# Patient Record
Sex: Male | Born: 1955
Health system: Southern US, Community
[De-identification: ages and names within clinical notes are randomized; demographics above are authoritative.]

## PROBLEM LIST (undated history)

## (undated) DIAGNOSIS — I2510 Atherosclerotic heart disease of native coronary artery without angina pectoris: Secondary | ICD-10-CM

## (undated) DIAGNOSIS — M1990 Unspecified osteoarthritis, unspecified site: Secondary | ICD-10-CM

## (undated) DIAGNOSIS — M792 Neuralgia and neuritis, unspecified: Secondary | ICD-10-CM

## (undated) DIAGNOSIS — G4730 Sleep apnea, unspecified: Secondary | ICD-10-CM

## (undated) DIAGNOSIS — Z955 Presence of coronary angioplasty implant and graft: Secondary | ICD-10-CM

## (undated) DIAGNOSIS — M109 Gout, unspecified: Secondary | ICD-10-CM

## (undated) DIAGNOSIS — I10 Essential (primary) hypertension: Secondary | ICD-10-CM

## (undated) DIAGNOSIS — D649 Anemia, unspecified: Secondary | ICD-10-CM

## (undated) DIAGNOSIS — G609 Hereditary and idiopathic neuropathy, unspecified: Secondary | ICD-10-CM

## (undated) DIAGNOSIS — Z8719 Personal history of other diseases of the digestive system: Secondary | ICD-10-CM

## (undated) DIAGNOSIS — T884XXA Failed or difficult intubation, initial encounter: Secondary | ICD-10-CM

## (undated) DIAGNOSIS — G8929 Other chronic pain: Secondary | ICD-10-CM

## (undated) DIAGNOSIS — N183 Chronic kidney disease, stage 3 unspecified: Secondary | ICD-10-CM

## (undated) DIAGNOSIS — M542 Cervicalgia: Secondary | ICD-10-CM

## (undated) DIAGNOSIS — M4804 Spinal stenosis, thoracic region: Secondary | ICD-10-CM

## (undated) DIAGNOSIS — M419 Scoliosis, unspecified: Secondary | ICD-10-CM

## (undated) DIAGNOSIS — E785 Hyperlipidemia, unspecified: Secondary | ICD-10-CM

## (undated) DIAGNOSIS — M5030 Other cervical disc degeneration, unspecified cervical region: Secondary | ICD-10-CM

## (undated) DIAGNOSIS — N189 Chronic kidney disease, unspecified: Secondary | ICD-10-CM

## (undated) DIAGNOSIS — I5189 Other ill-defined heart diseases: Secondary | ICD-10-CM

## (undated) DIAGNOSIS — F40240 Claustrophobia: Secondary | ICD-10-CM

## (undated) DIAGNOSIS — Z7982 Long term (current) use of aspirin: Secondary | ICD-10-CM

## (undated) HISTORY — DX: Sleep apnea, unspecified: G47.30

## (undated) HISTORY — PX: VASECTOMY: SHX75

## (undated) HISTORY — PX: HYDROCELE EXCISION: SHX482

---

## 2010-03-11 ENCOUNTER — Encounter: Payer: Self-pay | Admitting: Orthopaedic Surgery

## 2010-04-04 ENCOUNTER — Encounter: Payer: Self-pay | Admitting: Orthopaedic Surgery

## 2010-05-04 ENCOUNTER — Encounter: Payer: Self-pay | Admitting: Orthopaedic Surgery

## 2010-05-27 ENCOUNTER — Ambulatory Visit: Payer: Self-pay | Admitting: Internal Medicine

## 2010-06-04 ENCOUNTER — Ambulatory Visit: Payer: Self-pay | Admitting: Internal Medicine

## 2010-08-14 HISTORY — PX: COLONOSCOPY: SHX174

## 2010-08-14 LAB — HM COLONOSCOPY

## 2010-09-04 ENCOUNTER — Ambulatory Visit: Payer: Self-pay | Admitting: Family Medicine

## 2010-09-04 HISTORY — PX: CORONARY ANGIOPLASTY WITH STENT PLACEMENT: SHX49

## 2010-11-05 ENCOUNTER — Encounter: Payer: Self-pay | Admitting: Cardiology

## 2010-12-03 ENCOUNTER — Encounter: Payer: Self-pay | Admitting: Cardiology

## 2011-01-03 ENCOUNTER — Encounter: Payer: Self-pay | Admitting: Cardiology

## 2011-01-31 ENCOUNTER — Ambulatory Visit: Payer: Self-pay | Admitting: Otolaryngology

## 2011-02-04 ENCOUNTER — Ambulatory Visit: Payer: Self-pay | Admitting: Otolaryngology

## 2011-04-01 ENCOUNTER — Ambulatory Visit: Payer: Self-pay

## 2011-08-31 IMAGING — CR DG AC JOINTS BILAT
1 series · 2 of 2 positions shown · non-contrast
Comparison: none

REASON FOR EXAM: L shoulder pain, evaluate for AC separation
COMMENTS:

[Series 1: view not recorded · 0.17mm/px · 2 of 2 slices shown]
[im 1/2]
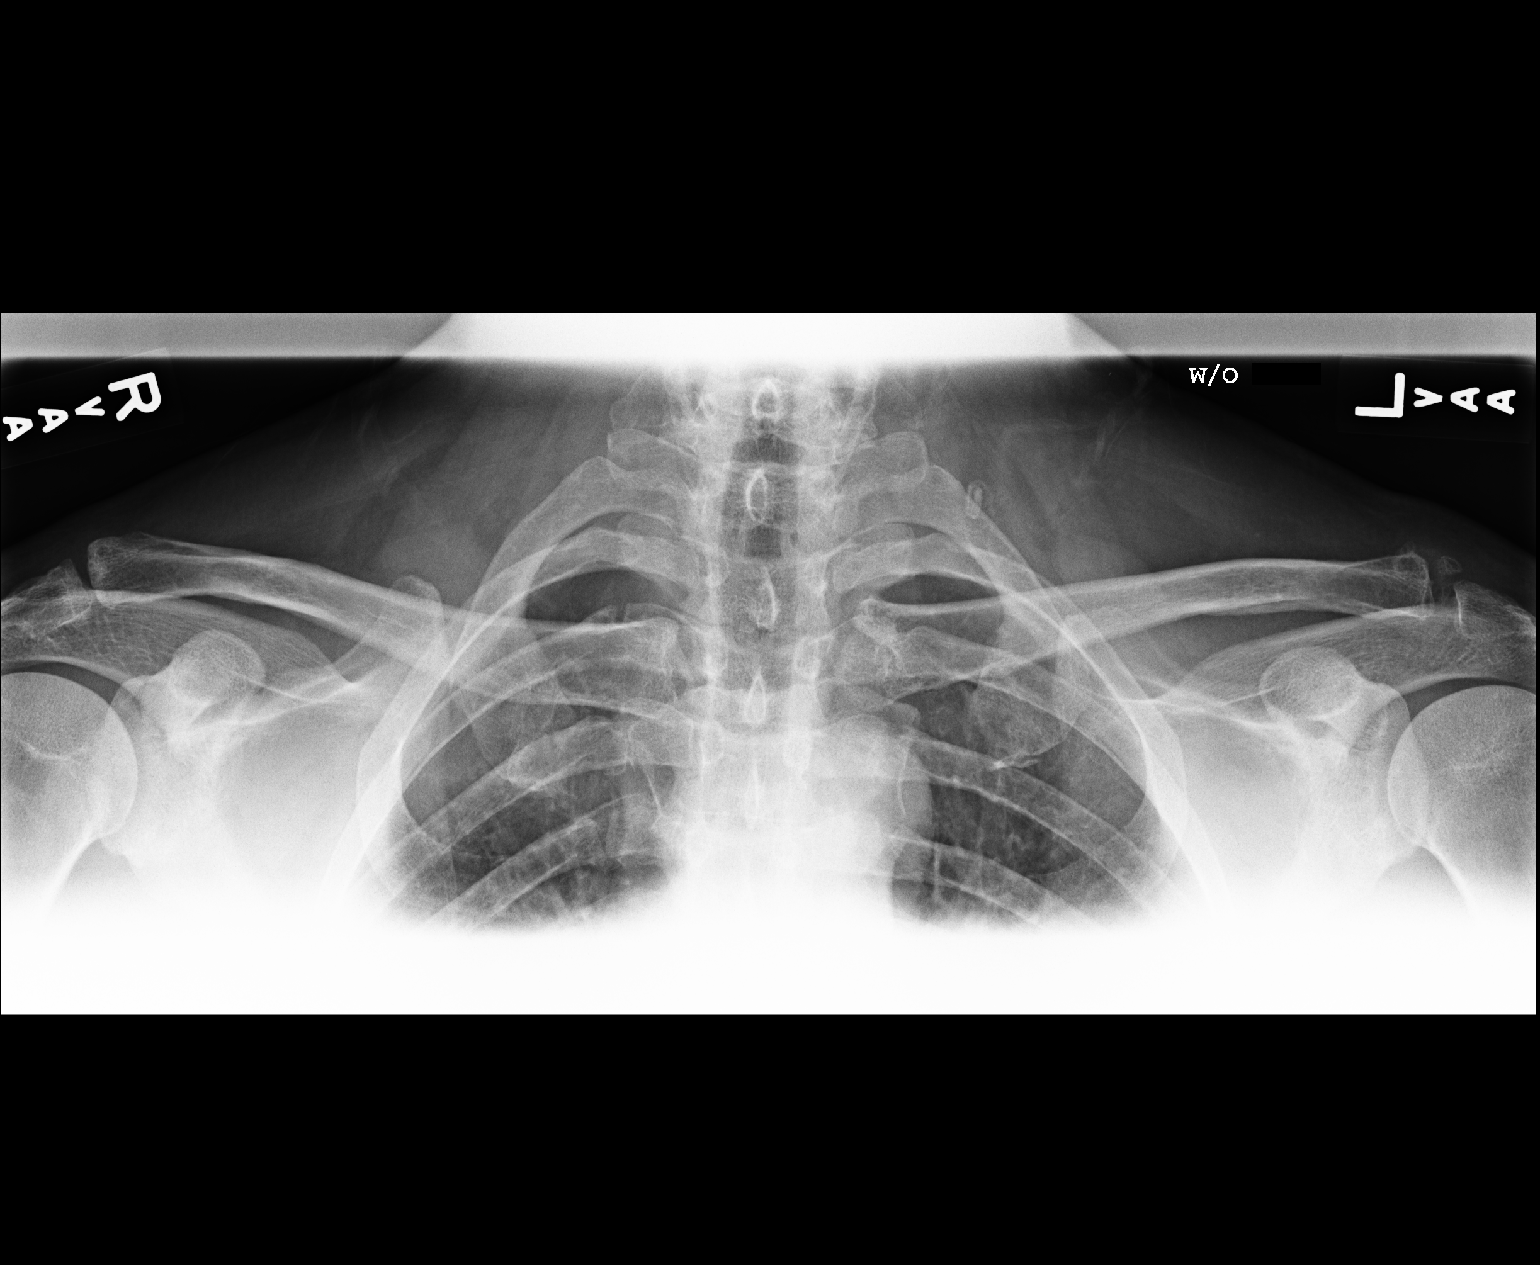
[im 2/2]
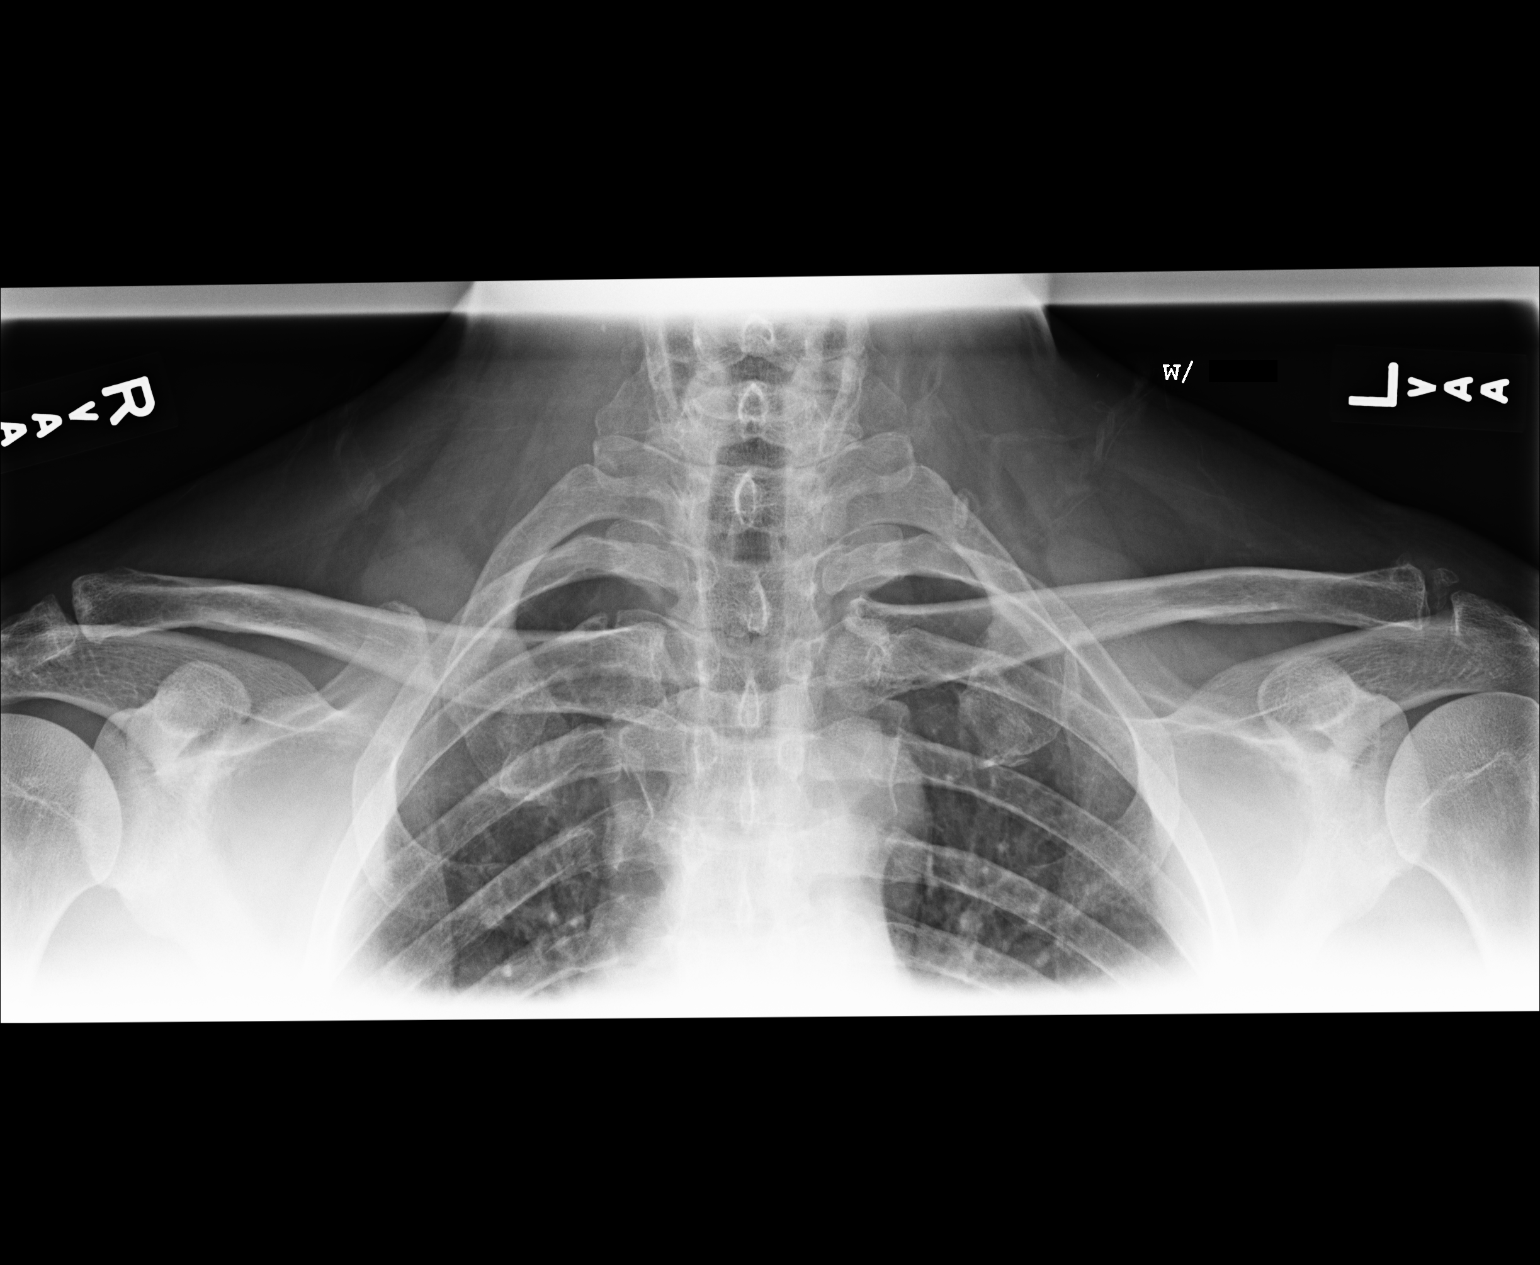

[2 of 2 positions shown; findings below may reference images not displayed]

PROCEDURE:     MDR - MDR ACROMO-CLAVICULAR JOINTS  - September 04, 2010 [DATE]

RESULT:     Bilateral views of the acromioclavicular joints were obtained
with and without weight-bearing. No subluxation at the left
acromioclavicular joint is seen. The joint space is slightly widened and
calcification is observed within the joint space. The findings suggest
residual change from prior grade 1 acromioclavicular separation. A grade 1
reinjury cannot be excluded but as noted there is no subluxation of the
lateral aspect of the left clavicle with respect to the acromion and no
widening of the acromiocoracoid space is seen to suggest coracoclavicular
ligament injury. The right clavicle is normal in appearance.
IMPRESSION: 1.     Chronic or subacute changes are noted at the left acromioclavicular
joint as described above but with no acromioclavicular subluxation
identified.

## 2011-10-08 DIAGNOSIS — G4733 Obstructive sleep apnea (adult) (pediatric): Secondary | ICD-10-CM | POA: Insufficient documentation

## 2011-10-08 DIAGNOSIS — I25119 Atherosclerotic heart disease of native coronary artery with unspecified angina pectoris: Secondary | ICD-10-CM | POA: Insufficient documentation

## 2011-10-08 DIAGNOSIS — I2510 Atherosclerotic heart disease of native coronary artery without angina pectoris: Secondary | ICD-10-CM | POA: Insufficient documentation

## 2013-09-04 HISTORY — PX: BILATERAL CARPAL TUNNEL RELEASE: SHX6508

## 2013-09-13 LAB — LIPID PANEL
Cholesterol: 144 mg/dL (ref 0–200)
HDL: 25 mg/dL — AB (ref 35–70)
LDL Cholesterol: 63 mg/dL
Triglycerides: 278 mg/dL — AB (ref 40–160)

## 2013-09-13 LAB — PSA: PSA: 1.2

## 2013-09-26 ENCOUNTER — Ambulatory Visit: Payer: Self-pay | Admitting: Orthopedic Surgery

## 2013-09-29 ENCOUNTER — Ambulatory Visit: Payer: Self-pay | Admitting: Orthopedic Surgery

## 2014-11-20 ENCOUNTER — Encounter: Payer: Self-pay | Admitting: Internal Medicine

## 2014-11-20 DIAGNOSIS — M1A0790 Idiopathic chronic gout, unspecified ankle and foot, without tophus (tophi): Secondary | ICD-10-CM | POA: Insufficient documentation

## 2014-11-20 DIAGNOSIS — D126 Benign neoplasm of colon, unspecified: Secondary | ICD-10-CM | POA: Insufficient documentation

## 2014-11-20 DIAGNOSIS — I10 Essential (primary) hypertension: Secondary | ICD-10-CM | POA: Insufficient documentation

## 2014-11-20 DIAGNOSIS — N529 Male erectile dysfunction, unspecified: Secondary | ICD-10-CM | POA: Insufficient documentation

## 2014-11-20 DIAGNOSIS — M5090 Cervical disc disorder, unspecified, unspecified cervical region: Secondary | ICD-10-CM | POA: Insufficient documentation

## 2014-11-20 DIAGNOSIS — G609 Hereditary and idiopathic neuropathy, unspecified: Secondary | ICD-10-CM | POA: Insufficient documentation

## 2014-11-20 DIAGNOSIS — E785 Hyperlipidemia, unspecified: Secondary | ICD-10-CM | POA: Insufficient documentation

## 2014-11-20 DIAGNOSIS — I2510 Atherosclerotic heart disease of native coronary artery without angina pectoris: Secondary | ICD-10-CM | POA: Insufficient documentation

## 2014-11-25 NOTE — Op Note (Signed)
PATIENT NAME:  Brian Payne, Brian Payne MR#:  [id number] DATE OF BIRTH:  August 23, 1955  DATE OF PROCEDURE:  09/29/2013  PREOPERATIVE DIAGNOSIS: Bilateral carpal tunnel syndrome.   POSTOPERATIVE DIAGNOSIS: Bilateral carpal tunnel syndrome.   PROCEDURE: Bilateral carpal tunnel release.   ANESTHESIA: General.   SURGEON: Laurene Footman, MD  DESCRIPTION OF PROCEDURE: The patient was brought to the operating room, and after adequate anesthesia was obtained, both arms were prepped and draped in the usual sterile fashion. The left arm was approached first. After patient identification and timeout procedures were completed, the tourniquet was raised on the left arm. A volar incision, approximately 2.5 cm incision in line with the ring metacarpal was carried out down through the skin and subcutaneous tissue. The transverse carpal ligament was identified and opened. A vascular hemostat was placed underneath this to protect the underlying structure. Release was carried out first distally and then proximally. In the proximal third of the carpal tunnel, there appeared to be significant compression of the nerve, and after release of this, there was good vascular blush. Inspection of the carpal tunnel revealed moderate flexor tenosynovitis. No crystalline disease was apparent, as the patient does have a gout history. After release and having appeared to have adequate release, the subcutaneous tissue was infiltrated with 10 mL of 0.5% Sensorcaine for postoperative analgesia. The wound was closed with simple interrupted 4-0 nylon skin suture. Xeroform, 4 x 4's, Webril and Ace wrap were applied. Tourniquet was let down. The right side was then approached in a similar fashion with identical findings.   TOURNIQUET TIME: Both sides had 13 minute tourniquet time with 250 mmHg.   COMPLICATIONS: There were no complications on either hand.  ESTIMATED BLOOD LOSS: Minimal.   CONDITION: To recovery room,  stable.  ____________________________ Laurene Footman, MD mjm:lb D: 09/29/2013 19:07:13 ET T: 09/30/2013 07:03:46 ET JOB#: [id number]  cc: Laurene Footman, MD, <Dictator> Laurene Footman MD ELECTRONICALLY SIGNED 09/30/2013 7:39

## 2015-01-17 ENCOUNTER — Encounter: Payer: Self-pay | Admitting: Internal Medicine

## 2015-01-17 ENCOUNTER — Ambulatory Visit (INDEPENDENT_AMBULATORY_CARE_PROVIDER_SITE_OTHER): Payer: BLUE CROSS/BLUE SHIELD | Admitting: Internal Medicine

## 2015-01-17 ENCOUNTER — Other Ambulatory Visit: Payer: Self-pay | Admitting: Internal Medicine

## 2015-01-17 VITALS — BP 144/98 | HR 72 | Ht 70.0 in | Wt 256.2 lb

## 2015-01-17 DIAGNOSIS — I10 Essential (primary) hypertension: Secondary | ICD-10-CM

## 2015-01-17 DIAGNOSIS — I2510 Atherosclerotic heart disease of native coronary artery without angina pectoris: Secondary | ICD-10-CM | POA: Diagnosis not present

## 2015-01-17 DIAGNOSIS — Z0000 Encounter for general adult medical examination without abnormal findings: Secondary | ICD-10-CM

## 2015-01-17 DIAGNOSIS — Z125 Encounter for screening for malignant neoplasm of prostate: Secondary | ICD-10-CM

## 2015-01-17 DIAGNOSIS — E785 Hyperlipidemia, unspecified: Secondary | ICD-10-CM | POA: Diagnosis not present

## 2015-01-17 LAB — POCT URINALYSIS DIPSTICK
BILIRUBIN UA: NEGATIVE
Glucose, UA: NEGATIVE
Ketones, UA: NEGATIVE
Leukocytes, UA: NEGATIVE
Nitrite, UA: NEGATIVE
PH UA: 5
Protein, UA: NEGATIVE
RBC UA: NEGATIVE
Spec Grav, UA: 1.015
Urobilinogen, UA: 0.2

## 2015-01-17 MED ORDER — LOSARTAN POTASSIUM 100 MG PO TABS: 100.0000 mg | ORAL_TABLET | Freq: Every day | ORAL | Status: DC

## 2015-01-17 NOTE — Patient Instructions (Signed)
Increase Losartan to 100 mg per day (one whole pill)

## 2015-01-17 NOTE — Progress Notes (Signed)
Date:  01/17/2015   Name:  Brian Payne   DOB:  11/05/1955   MRN:  [id number]   Chief Complaint: Annual Exam and Hypertension Brian Payne is a 59 y.o. male who presents today for his Complete Annual Exam. He feels fairly well. He reports exercising - elliptical, walking etc 30 minutes daily, plus yard work. He reports he is sleeping well. He is compliant with his CPap use. Hypertension This is a chronic problem. The current episode started more than 1 year ago. The problem is resistant (tends to go up with stress or exertion). Associated symptoms include neck pain (followed by Dr. Sharlet Payne). Pertinent negatives include no chest pain, headaches, palpitations or shortness of breath. There are no associated agents to hypertension. Hypertensive end-organ damage includes CAD/MI. There is no history of PVD or retinopathy. There is no history of chronic renal disease.  Hyperlipidemia This is a chronic problem. The current episode started more than 1 year ago. The problem is controlled. Recent lipid tests were reviewed and are normal. He has no history of chronic renal disease, diabetes or nephrotic syndrome. Pertinent negatives include no chest pain, myalgias or shortness of breath. Current antihyperlipidemic treatment includes statins. The current treatment provides significant improvement of lipids. There are no compliance problems.    Chronic neck pain -He is followed by pain management.  Current therapy is hydrocodone and gabapentin.  He does fairly well on this regimen.  He has tried ESI without improvement.  He is not a surgical candidate.  Neuropathy - stable numbness in both feet - diagnosed as idiopathic.  He also has numbness in his fingers that did not improve after CTR.  It is not worsening and does not limit his activities except for fine motor movements with small objects.  Review of Systems  Constitutional: Negative for fever, activity change, appetite change and unexpected weight change.   HENT: Positive for congestion, sinus pressure (worse with CPAP use) and tinnitus. Negative for hearing loss, nosebleeds and trouble swallowing.   Eyes: Negative for photophobia and visual disturbance.  Respiratory: Positive for apnea. Negative for choking, chest tightness, shortness of breath and wheezing.   Cardiovascular: Negative for chest pain, palpitations and leg swelling.  Gastrointestinal: Negative for nausea, abdominal pain, diarrhea, constipation and blood in stool.  Endocrine: Negative for polyuria.  Genitourinary: Negative for urgency, frequency, hematuria and difficulty urinating.  Musculoskeletal: Positive for joint swelling (with gout flares in feet) and neck pain (followed by Dr. Sharlet Payne). Negative for myalgias.  Skin: Negative for color change and rash.       He noticed a soft tissue fullness over right lateral/posterior lower ribs   Neurological: Positive for numbness (both feet). Negative for dizziness, tremors, weakness and headaches.  Hematological: Negative for adenopathy.  Psychiatric/Behavioral: Negative for suicidal ideas, sleep disturbance and dysphoric mood.    Patient Active Problem List   Diagnosis Date Noted  . Cervical disc disease 11/20/2014  . Tubular adenoma of colon 11/20/2014  . Gout 11/20/2014  . Dyslipidemia 11/20/2014  . ED (erectile dysfunction) of organic origin 11/20/2014  . Essential (primary) hypertension 11/20/2014  . Idiopathic peripheral neuropathy 11/20/2014  . Obstructive apnea 10/08/2011  . Arteriosclerosis of coronary artery 10/08/2011    Prior to Admission medications   Medication Sig Start Date End Date Taking? Authorizing Provider  allopurinol (ZYLOPRIM) 300 MG tablet Take 2 tablets by mouth daily.   Yes Historical Provider, MD  aspirin 81 MG chewable tablet Chew 1 tablet by mouth daily.  Yes Historical Provider, MD  gabapentin (NEURONTIN) 100 MG capsule Take 5 capsules by mouth daily. One in AM and 4 in PM 12/21/14  Yes  Historical Provider, MD  HYDROcodone-acetaminophen (NORCO) 7.5-325 MG per tablet Take 0.5-1 tablets by mouth 2 (two) times daily as needed. 01/08/15  Yes Historical Provider, MD  losartan (COZAAR) 100 MG tablet Take 0.5 tablets by mouth daily. 07/24/14  Yes Historical Provider, MD  meloxicam (MOBIC) 7.5 MG tablet Take 1 tablet by mouth daily. 01/07/15  Yes Historical Provider, MD  metoprolol tartrate (LOPRESSOR) 25 MG tablet Take 1 tablet by mouth daily. 10/09/14  Yes Historical Provider, MD  nitroGLYCERIN (NITROSTAT) 0.4 MG SL tablet Place 1 tablet under the tongue as needed.   Yes Historical Provider, MD  Omeprazole 20 MG TBEC Take 1 tablet by mouth daily.    Historical Provider, MD  rosuvastatin (CRESTOR) 5 MG tablet Take 0.5 tablets by mouth every other day.    Historical Provider, MD    Allergies  Allergen Reactions  . Statins Other (See Comments)    Muscle aches  . Codeine Itching and Rash    Past Surgical History  Procedure Laterality Date  . Vasectomy    . Hydrocele excision    . Coronary angioplasty with stent placement  020112    [facility]  . Bilateral carpal tunnel release  09/2013    History  Substance Use Topics  . Smoking status: Never Smoker   . Smokeless tobacco: Not on file  . Alcohol Use: No     Medication list has been reviewed and updated.  Physical Examination:  Physical Exam  Constitutional: He is oriented to person, place, and time. He appears well-developed and well-nourished. No distress.  HENT:  Head: Normocephalic and atraumatic.  Right Ear: External ear normal.  Left Ear: External ear normal.  Mouth/Throat: Oropharynx is clear and moist.  Eyes: Conjunctivae are normal. Pupils are equal, round, and reactive to light.  Neck: Normal range of motion. Neck supple. No thyromegaly present.  Cardiovascular: Normal rate, regular rhythm and normal heart sounds.   Pulmonary/Chest: Effort normal and breath sounds normal. No respiratory distress. He has no wheezes.  He has no rales.  Abdominal: Soft. Bowel sounds are normal. He exhibits no distension and no mass. There is no tenderness.  Musculoskeletal: Normal range of motion. He exhibits no edema.       Right knee: He exhibits no swelling and no effusion.       Left knee: He exhibits no swelling and no effusion. Tenderness found. Medial joint line tenderness noted.  Lymphadenopathy:    He has no cervical adenopathy.  Neurological: He is alert and oriented to person, place, and time. He has normal reflexes. A sensory deficit (decreased sensation to light touch fingertips and feet) is present.  Skin: Skin is warm, dry and intact.     Psychiatric: He has a normal mood and affect. His behavior is normal. Thought content normal.  Nursing note and vitals reviewed.  BP 144/98 mmHg  Pulse 72  Ht 5\' 10"  (1.778 m)  Wt 256 lb 3.2 oz (116.212 kg)  BMI 36.76 kg/m2  Assessment and Plan: 1. Annual physical exam Stable exam.  Pt encouraged to continue exercise Reassured regarding probably lipoma - can refer for excision if symptomatic Continue follow up with specialists for neck pain and gout - POCT urinalysis dipstick  2. Essential (primary) hypertension Not well controlled Will increase dose of losartan to 100 mg - CBC with Differential/Platelet - TSH -  losartan (COZAAR) 100 MG tablet; Take 1 tablet (100 mg total) by mouth daily.  Dispense: 30 tablet; Refill: 5  3. Dyslipidemia On crestor - Lipid panel  4. Arteriosclerosis of coronary artery Stable without sympotms Continue aspirin and beta blocker - Comprehensive metabolic panel  5. Prostate cancer screening DRE deferred to lack of symptoms - PSA   Halina Maidens, MD [facility]  01/17/2015

## 2015-01-18 LAB — COMPREHENSIVE METABOLIC PANEL
ALT: 34 IU/L (ref 0–44)
AST: 29 IU/L (ref 0–40)
Albumin/Globulin Ratio: 2.1 (ref 1.1–2.5)
Albumin: 4.8 g/dL (ref 3.5–5.5)
Alkaline Phosphatase: 61 IU/L (ref 39–117)
BUN / CREAT RATIO: 22 — AB (ref 9–20)
BUN: 26 mg/dL — ABNORMAL HIGH (ref 6–24)
Bilirubin Total: 0.7 mg/dL (ref 0.0–1.2)
CALCIUM: 10 mg/dL (ref 8.7–10.2)
CO2: 21 mmol/L (ref 18–29)
CREATININE: 1.16 mg/dL (ref 0.76–1.27)
Chloride: 105 mmol/L (ref 97–108)
GFR calc non Af Amer: 69 mL/min/{1.73_m2} (ref >59)
GFR, EST AFRICAN AMERICAN: 79 mL/min/{1.73_m2} (ref >59)
GLOBULIN, TOTAL: 2.3 g/dL (ref 1.5–4.5)
Glucose: 89 mg/dL (ref 65–99)
Potassium: 4.4 mmol/L (ref 3.5–5.2)
Sodium: 143 mmol/L (ref 134–144)
Total Protein: 7.1 g/dL (ref 6.0–8.5)

## 2015-01-18 LAB — LIPID PANEL
CHOLESTEROL TOTAL: 172 mg/dL (ref 100–199)
Chol/HDL Ratio: 5.9 ratio units — ABNORMAL HIGH (ref 0.0–5.0)
HDL: 29 mg/dL — AB (ref >39)
LDL Calculated: 89 mg/dL (ref 0–99)
TRIGLYCERIDES: 268 mg/dL — AB (ref 0–149)
VLDL Cholesterol Cal: 54 mg/dL — ABNORMAL HIGH (ref 5–40)

## 2015-01-18 LAB — CBC WITH DIFFERENTIAL/PLATELET
Basophils Absolute: 0 10*3/uL (ref 0.0–0.2)
Basos: 1 %
EOS (ABSOLUTE): 0.2 10*3/uL (ref 0.0–0.4)
EOS: 4 %
Hematocrit: 43.3 % (ref 37.5–51.0)
Hemoglobin: 14.4 g/dL (ref 12.6–17.7)
IMMATURE GRANS (ABS): 0 10*3/uL (ref 0.0–0.1)
IMMATURE GRANULOCYTES: 0 %
Lymphocytes Absolute: 1.4 10*3/uL (ref 0.7–3.1)
Lymphs: 26 %
MCH: 31.3 pg (ref 26.6–33.0)
MCHC: 33.3 g/dL (ref 31.5–35.7)
MCV: 94 fL (ref 79–97)
Monocytes Absolute: 0.6 10*3/uL (ref 0.1–0.9)
Monocytes: 10 %
NEUTROS PCT: 59 %
Neutrophils Absolute: 3.3 10*3/uL (ref 1.4–7.0)
PLATELETS: 153 10*3/uL (ref 150–379)
RBC: 4.6 x10E6/uL (ref 4.14–5.80)
RDW: 14.7 % (ref 12.3–15.4)
WBC: 5.5 10*3/uL (ref 3.4–10.8)

## 2015-01-18 LAB — TSH: TSH: 1.11 u[IU]/mL (ref 0.450–4.500)

## 2015-01-18 LAB — PSA: Prostate Specific Ag, Serum: 1.6 ng/mL (ref 0.0–4.0)

## 2015-01-29 ENCOUNTER — Other Ambulatory Visit: Payer: Self-pay

## 2015-01-29 DIAGNOSIS — I10 Essential (primary) hypertension: Secondary | ICD-10-CM

## 2015-01-29 MED ORDER — LOSARTAN POTASSIUM 100 MG PO TABS: 100.0000 mg | ORAL_TABLET | Freq: Every day | ORAL | Status: DC

## 2015-02-12 ENCOUNTER — Encounter: Payer: Self-pay | Admitting: Internal Medicine

## 2015-02-12 ENCOUNTER — Ambulatory Visit (INDEPENDENT_AMBULATORY_CARE_PROVIDER_SITE_OTHER): Payer: BLUE CROSS/BLUE SHIELD | Admitting: Internal Medicine

## 2015-02-12 VITALS — BP 140/82 | HR 72 | Ht 70.0 in | Wt 260.2 lb

## 2015-02-12 DIAGNOSIS — M5090 Cervical disc disorder, unspecified, unspecified cervical region: Secondary | ICD-10-CM | POA: Diagnosis not present

## 2015-02-12 DIAGNOSIS — I10 Essential (primary) hypertension: Secondary | ICD-10-CM

## 2015-02-12 DIAGNOSIS — H9312 Tinnitus, left ear: Secondary | ICD-10-CM | POA: Insufficient documentation

## 2015-02-12 DIAGNOSIS — E785 Hyperlipidemia, unspecified: Secondary | ICD-10-CM

## 2015-02-12 DIAGNOSIS — I2510 Atherosclerotic heart disease of native coronary artery without angina pectoris: Secondary | ICD-10-CM

## 2015-02-12 NOTE — Progress Notes (Signed)
Date:  02/12/2015   Name:  Brian Payne   DOB:  [birth year]   MRN:  [id number]   Chief Complaint: No chief complaint on file. Hypertension This is a chronic problem. The current episode started more than 1 year ago. The problem has been waxing and waning since onset. The problem is uncontrolled. Associated symptoms include neck pain. Pertinent negatives include no chest pain, headaches or shortness of breath. Past treatments include angiotensin blockers and beta blockers (unable to tolerate losartan 100 mg due to lightheadness so stopped it completely). The current treatment provides moderate improvement. Compliance problems include medication side effects.  Hypertensive end-organ damage includes CAD/MI.  Hyperlipidemia This is a chronic problem. The problem is uncontrolled. Recent lipid tests were reviewed and are high. Associated symptoms include leg pain and myalgias. Pertinent negatives include no chest pain or shortness of breath. Treatments tried: stopped crestor 2.5 mg QOD due to myalgia. Compliance problems include medication side effects.  Risk factors for coronary artery disease include dyslipidemia.   He was seen by Dr. Sharlet Salina - mentioned that he gets dizzy when he looks up and has some posterior neck pain.  Dr. Sharlet Salina suggested that he might need a Carotid US.  He also has tinnitus for which he has seen ENT - no obvious cause found.  Review of Systems:  Review of Systems  Constitutional: Negative for fever.  HENT: Positive for tinnitus (on left).   Respiratory: Negative for cough, choking and shortness of breath.   Cardiovascular: Negative for chest pain.  Musculoskeletal: Positive for myalgias, back pain (neck pain), arthralgias and neck pain.  Neurological: Positive for light-headedness (when he looks up). Negative for syncope and headaches.  Psychiatric/Behavioral: Negative for confusion.    Patient Active Problem List   Diagnosis Date Noted  . Cervical disc disease  11/20/2014  . Tubular adenoma of colon 11/20/2014  . Gout 11/20/2014  . Dyslipidemia 11/20/2014  . ED (erectile dysfunction) of organic origin 11/20/2014  . Essential (primary) hypertension 11/20/2014  . Idiopathic peripheral neuropathy 11/20/2014  . Obstructive apnea 10/08/2011  . Arteriosclerosis of coronary artery 10/08/2011    Prior to Admission medications   Medication Sig Start Date End Date Taking? Authorizing Provider  allopurinol (ZYLOPRIM) 300 MG tablet Take 2 tablets by mouth daily.   Yes Historical Provider, MD  aspirin 81 MG chewable tablet Chew 1 tablet by mouth daily.   Yes Historical Provider, MD  gabapentin (NEURONTIN) 100 MG capsule Take 5 capsules by mouth daily. One in AM and 4 in PM 12/21/14  Yes Historical Provider, MD  HYDROcodone-acetaminophen (NORCO) 7.5-325 MG per tablet Take 0.5-1 tablets by mouth 2 (two) times daily as needed. 01/08/15  Yes Historical Provider, MD  meloxicam (MOBIC) 7.5 MG tablet Take 1 tablet by mouth daily. 01/07/15  Yes Historical Provider, MD  metoprolol tartrate (LOPRESSOR) 25 MG tablet Take 1 tablet by mouth daily. 10/09/14  Yes Historical Provider, MD  nitroGLYCERIN (NITROSTAT) 0.4 MG SL tablet Place 1 tablet under the tongue as needed.   Yes Historical Provider, MD  losartan (COZAAR) 100 MG tablet Take 1 tablet (100 mg total) by mouth daily. Patient not taking: Reported on 02/12/2015 01/29/15   Glean Hess, MD  Omeprazole 20 MG TBEC Take 1 tablet by mouth daily.    Historical Provider, MD  rosuvastatin (CRESTOR) 5 MG tablet Take 0.5 tablets by mouth every other day.    Historical Provider, MD    Allergies  Allergen Reactions  . Statins Other (See Comments)  Muscle aches  . Codeine Itching and Rash    Past Surgical History  Procedure Laterality Date  . Vasectomy    . Hydrocele excision    . Coronary angioplasty with stent placement  020112    [facility]  . Bilateral carpal tunnel release  09/2013    History  Substance Use Topics   . Smoking status: Never Smoker   . Smokeless tobacco: Not on file  . Alcohol Use: No     Medication list has been reviewed and updated.  Physical Examination:  Physical Exam  Constitutional: He appears well-developed and well-nourished.  Neck: Normal carotid pulses present. Muscular tenderness present. No tracheal tenderness present. Carotid bruit is not present. Decreased range of motion present. No thyromegaly present.  Cardiovascular: Normal rate, regular rhythm and S1 normal.   Pulmonary/Chest: Breath sounds normal.  Musculoskeletal: He exhibits no edema.  Neurological: A sensory deficit (decreased sensation in both feet) is present.    BP 140/82 mmHg  Pulse 72  Ht 5\' 10"  (1.778 m)  Wt 260 lb 3.2 oz (118.026 kg)  BMI 37.33 kg/m2  Assessment and Plan: 1. Dyslipidemia Intolerant to lipitor and crestor; will advise on therapy after study returns - Carotid; Future  2. Essential (primary) hypertension Resume losartan 50 mg daily - Carotid; Future  3. Arteriosclerosis of coronary artery S/p stents - followed by Dr. Wadie Lessen at [facility]  4. Cervical disc disease Continue follow up with Dr. Sharlet Salina  5. Tinnitus of left ear Chronic, idiopathic   Halina Maidens, MD [facility]  02/12/2015

## 2015-02-15 ENCOUNTER — Other Ambulatory Visit: Payer: Self-pay | Admitting: Internal Medicine

## 2015-02-15 DIAGNOSIS — I10 Essential (primary) hypertension: Secondary | ICD-10-CM

## 2015-02-15 DIAGNOSIS — R42 Dizziness and giddiness: Secondary | ICD-10-CM

## 2015-02-15 DIAGNOSIS — E785 Hyperlipidemia, unspecified: Secondary | ICD-10-CM

## 2015-02-23 ENCOUNTER — Ambulatory Visit (INDEPENDENT_AMBULATORY_CARE_PROVIDER_SITE_OTHER): Payer: BLUE CROSS/BLUE SHIELD

## 2015-02-23 DIAGNOSIS — E785 Hyperlipidemia, unspecified: Secondary | ICD-10-CM | POA: Diagnosis not present

## 2015-02-23 DIAGNOSIS — R42 Dizziness and giddiness: Secondary | ICD-10-CM

## 2015-02-23 DIAGNOSIS — I10 Essential (primary) hypertension: Secondary | ICD-10-CM | POA: Diagnosis not present

## 2015-02-25 ENCOUNTER — Encounter: Payer: Self-pay | Admitting: Internal Medicine

## 2015-02-25 DIAGNOSIS — I739 Peripheral vascular disease, unspecified: Secondary | ICD-10-CM

## 2015-02-25 DIAGNOSIS — I779 Disorder of arteries and arterioles, unspecified: Secondary | ICD-10-CM | POA: Insufficient documentation

## 2015-04-18 ENCOUNTER — Ambulatory Visit: Payer: BLUE CROSS/BLUE SHIELD | Admitting: Internal Medicine

## 2015-04-25 ENCOUNTER — Ambulatory Visit (INDEPENDENT_AMBULATORY_CARE_PROVIDER_SITE_OTHER): Payer: BLUE CROSS/BLUE SHIELD | Admitting: Internal Medicine

## 2015-04-25 ENCOUNTER — Other Ambulatory Visit: Payer: Self-pay | Admitting: Internal Medicine

## 2015-04-25 ENCOUNTER — Encounter: Payer: Self-pay | Admitting: Internal Medicine

## 2015-04-25 VITALS — BP 152/88 | HR 72 | Ht 70.0 in | Wt 261.6 lb

## 2015-04-25 DIAGNOSIS — Z23 Encounter for immunization: Secondary | ICD-10-CM | POA: Diagnosis not present

## 2015-04-25 DIAGNOSIS — I10 Essential (primary) hypertension: Secondary | ICD-10-CM

## 2015-04-25 DIAGNOSIS — I779 Disorder of arteries and arterioles, unspecified: Secondary | ICD-10-CM | POA: Diagnosis not present

## 2015-04-25 DIAGNOSIS — I739 Peripheral vascular disease, unspecified: Secondary | ICD-10-CM

## 2015-04-25 NOTE — Progress Notes (Signed)
Date:  04/25/2015   Name:  Brian Payne   DOB:  [birth year]   MRN:  [id number]   Chief Complaint: Hypertension Hypertension Pertinent negatives include no chest pain, palpitations or shortness of breath.  In June the patient's blood pressure was elevated so losartan was increased to 100 mg. He returned in July because he couldn't tolerate the 100 mg losartan and had stopped it completely. His pressure was still high so he was instructed to begin losartan 50 mg daily. Since then he's been taking losartan 100 mg daily. He admits to a high sodium diet. At this point here. He is tolerating losartan without troubles. Blood pressures at home are lower than when he is in the doctor's office. However he does admit that they are higher now than they've been in the past. Exacerbating factors include chronic NSAID use. We discussed changing him to a stronger ARB but he is extremely reluctant.   Review of Systems:  Review of Systems  Constitutional: Negative for fever, fatigue and unexpected weight change.  HENT: Negative for tinnitus and trouble swallowing.   Eyes: Negative for visual disturbance.  Respiratory: Negative for cough, chest tightness and shortness of breath.   Cardiovascular: Negative for chest pain, palpitations and leg swelling.  Musculoskeletal: Positive for arthralgias.  Skin: Negative for color change and rash.    Patient Active Problem List   Diagnosis Date Noted  . Mild carotid artery disease 02/25/2015  . Tinnitus of left ear 02/12/2015  . Cervical disc disease 11/20/2014  . Tubular adenoma of colon 11/20/2014  . Gout 11/20/2014  . Dyslipidemia 11/20/2014  . ED (erectile dysfunction) of organic origin 11/20/2014  . Essential (primary) hypertension 11/20/2014  . Idiopathic peripheral neuropathy 11/20/2014  . Obstructive apnea 10/08/2011  . Arteriosclerosis of coronary artery 10/08/2011    Prior to Admission medications   Medication Sig Start Date End Date Taking?  Authorizing Provider  allopurinol (ZYLOPRIM) 300 MG tablet Take 2 tablets by mouth daily.   Yes Historical Provider, MD  aspirin 81 MG chewable tablet Chew 1 tablet by mouth daily.   Yes Historical Provider, MD  etodolac (LODINE) 500 MG tablet Take 1 tablet by mouth 2 (two) times daily as needed. 04/05/15  Yes Historical Provider, MD  gabapentin (NEURONTIN) 100 MG capsule Take 5 capsules by mouth daily. One in AM and 4 in PM 12/21/14  Yes Historical Provider, MD  HYDROcodone-acetaminophen (NORCO) 7.5-325 MG per tablet Take 0.5-1 tablets by mouth 2 (two) times daily as needed. 01/08/15  Yes Historical Provider, MD  losartan (COZAAR) 100 MG tablet Take 1 tablet (100 mg total) by mouth daily. Patient taking differently: Take 50 mg by mouth daily.  01/29/15  Yes Glean Hess, MD  metoprolol tartrate (LOPRESSOR) 25 MG tablet Take 1 tablet by mouth daily. 10/09/14  Yes Historical Provider, MD  nitroGLYCERIN (NITROSTAT) 0.4 MG SL tablet Place 1 tablet under the tongue as needed.   Yes Historical Provider, MD    Allergies  Allergen Reactions  . Statins Other (See Comments)    Muscle aches  . Codeine Itching and Rash    Past Surgical History  Procedure Laterality Date  . Vasectomy    . Hydrocele excision    . Coronary angioplasty with stent placement  020112    [facility]  . Bilateral carpal tunnel release  09/2013    Social History  Substance Use Topics  . Smoking status: Never Smoker   . Smokeless tobacco: None  . Alcohol Use: No  Medication list has been reviewed and updated.  Physical Examination:  Physical Exam  Constitutional: He is oriented to person, place, and time. He appears well-developed and well-nourished.  Neck: No thyromegaly present.  Cardiovascular: Normal rate, regular rhythm and normal heart sounds.   Pulmonary/Chest: Effort normal and breath sounds normal. No respiratory distress. He has no wheezes.  Lymphadenopathy:    He has no cervical adenopathy.  Neurological:  He is alert and oriented to person, place, and time.  Psychiatric: He has a normal mood and affect. His behavior is normal.    BP 158/82 mmHg  Pulse 72  Ht 5\' 10"  (1.778 m)  Wt 261 lb 9.6 oz (118.661 kg)  BMI 37.54 kg/m2  Assessment and Plan: 1. Essential (primary) hypertension Not well controlled however with salt indiscretion We'll continue losartan 100 mg; patient will monitor blood pressures at home and return for follow-up as needed  2. Mild carotid artery disease Recent carotid ultrasound reviewed  3. Need for influenza vaccination - Flu Vaccine QUAD 36+ mos IM   Halina Maidens, MD [facility]  04/25/2015    Halina Maidens, MD [facility]  04/25/2015

## 2015-05-30 ENCOUNTER — Other Ambulatory Visit: Payer: Self-pay | Admitting: Internal Medicine

## 2015-10-08 ENCOUNTER — Ambulatory Visit (INDEPENDENT_AMBULATORY_CARE_PROVIDER_SITE_OTHER): Payer: BLUE CROSS/BLUE SHIELD | Admitting: Internal Medicine

## 2015-10-08 ENCOUNTER — Encounter: Payer: Self-pay | Admitting: Internal Medicine

## 2015-10-08 VITALS — BP 122/72 | HR 75 | Temp 98.3°F | Ht 70.0 in | Wt 265.8 lb

## 2015-10-08 DIAGNOSIS — J40 Bronchitis, not specified as acute or chronic: Secondary | ICD-10-CM | POA: Diagnosis not present

## 2015-10-08 MED ORDER — HYDROCODONE-HOMATROPINE 5-1.5 MG/5ML PO SYRP: 5.0000 mL | ORAL_SOLUTION | Freq: Four times a day (QID) | ORAL | Status: DC | PRN

## 2015-10-08 MED ORDER — LEVOFLOXACIN 500 MG PO TABS
500.0000 mg | ORAL_TABLET | Freq: Every day | ORAL | Status: DC
Start: 2015-10-08 — End: 2015-10-10

## 2015-10-08 NOTE — Progress Notes (Signed)
Date:  10/08/2015   Name:  Brian Payne   DOB:  05-Jun-1956   MRN:  [id number]   Chief Complaint: Cough Cough This is a new problem. The current episode started in the past 7 days. The problem has been unchanged. The problem occurs hourly. The cough is non-productive. Associated symptoms include a sore throat and shortness of breath. Pertinent negatives include no chest pain, chills, nasal congestion, rash or wheezing.     Review of Systems  Constitutional: Negative for chills.  HENT: Positive for sore throat.   Respiratory: Positive for cough and shortness of breath. Negative for wheezing.   Cardiovascular: Negative for chest pain.  Skin: Negative for rash.    Patient Active Problem List   Diagnosis Date Noted  . Mild carotid artery disease ([place]) 02/25/2015  . Tinnitus of left ear 02/12/2015  . Cervical disc disease 11/20/2014  . Tubular adenoma of colon 11/20/2014  . Gout 11/20/2014  . Dyslipidemia 11/20/2014  . ED (erectile dysfunction) of organic origin 11/20/2014  . Essential (primary) hypertension 11/20/2014  . Idiopathic peripheral neuropathy ([place]) 11/20/2014  . Obstructive apnea 10/08/2011  . Arteriosclerosis of coronary artery 10/08/2011    Prior to Admission medications   Medication Sig Start Date End Date Taking? Authorizing Provider  allopurinol (ZYLOPRIM) 300 MG tablet Take 2 tablets by mouth daily.   Yes Historical Provider, MD  aspirin 81 MG chewable tablet Chew 1 tablet by mouth daily.   Yes Historical Provider, MD  gabapentin (NEURONTIN) 100 MG capsule Take 5 capsules by mouth daily. One in AM and 4 in PM 12/21/14  Yes Historical Provider, MD  HYDROcodone-acetaminophen (NORCO) 7.5-325 MG per tablet Take 0.5-1 tablets by mouth 2 (two) times daily as needed. 01/08/15  Yes Historical Provider, MD  losartan (COZAAR) 100 MG tablet Take 1 tablet (100 mg total) by mouth daily. Patient taking differently: Take 50 mg by mouth daily.  01/29/15  Yes Glean Hess, MD   meloxicam (MOBIC) 7.5 MG tablet Take 1 tablet by mouth daily. 09/19/15  Yes Historical Provider, MD  metoprolol succinate (TOPROL-XL) 25 MG 24 hr tablet TAKE 1 TABLET BY MOUTH EVERY DAY 05/30/15  Yes Glean Hess, MD  nitroGLYCERIN (NITROSTAT) 0.4 MG SL tablet Place 1 tablet under the tongue as needed.   Yes Historical Provider, MD    Allergies  Allergen Reactions  . Statins Other (See Comments)    Muscle aches  . Codeine Itching and Rash    Past Surgical History  Procedure Laterality Date  . Vasectomy    . Hydrocele excision    . Coronary angioplasty with stent placement  [id number]    [facility]  . Bilateral carpal tunnel release  09/2013    Social History  Substance Use Topics  . Smoking status: Never Smoker   . Smokeless tobacco: None  . Alcohol Use: No     Medication list has been reviewed and updated.   Physical Exam  Constitutional: He is oriented to person, place, and time. He appears well-developed. No distress.  HENT:  Head: Normocephalic and atraumatic.  Right Ear: Tympanic membrane and ear canal normal.  Left Ear: Tympanic membrane and ear canal normal.  Mouth/Throat: Posterior oropharyngeal erythema present. No oropharyngeal exudate or posterior oropharyngeal edema.  Cardiovascular: Normal rate, regular rhythm and normal heart sounds.   Pulmonary/Chest: Effort normal. No respiratory distress. He has decreased breath sounds. He has no wheezes. He has no rhonchi.  Musculoskeletal: Normal range of motion.  Neurological: He  is alert and oriented to person, place, and time.  Skin: Skin is warm and dry. No rash noted.  Psychiatric: He has a normal mood and affect. His behavior is normal. Thought content normal.  Nursing note and vitals reviewed.   BP 122/72 mmHg  Pulse 75  Temp(Src) 98.3 F (36.8 C) (Oral)  Ht 5\' 10"  (1.778 m)  Wt 265 lb 12.8 oz (120.566 kg)  BMI 38.14 kg/m2  SpO2 97%  Assessment and Plan: 1. Bronchitis Continue fluids, activity as  tolerated - levofloxacin (LEVAQUIN) 500 MG tablet; Take 1 tablet (500 mg total) by mouth daily.  Dispense: 7 tablet; Refill: 0 - HYDROcodone-homatropine (HYCODAN) 5-1.5 MG/5ML syrup; Take 5 mLs by mouth every 6 (six) hours as needed for cough.  Dispense: 120 mL; Refill: 0   Halina Maidens, MD [facility]  10/08/2015

## 2015-10-10 ENCOUNTER — Telehealth: Payer: Self-pay

## 2015-10-10 ENCOUNTER — Other Ambulatory Visit: Payer: Self-pay | Admitting: Internal Medicine

## 2015-10-10 MED ORDER — DOXYCYCLINE HYCLATE 100 MG PO TABS: 100.0000 mg | ORAL_TABLET | Freq: Two times a day (BID) | ORAL | Status: DC

## 2015-10-10 NOTE — Telephone Encounter (Signed)
Patient states that he has been taking the Levaquin but, cannot tolerate Levaquin. He states that this medication makes him feel funny in his head. He would like to know if you could send him in another antibiotic.

## 2015-11-14 ENCOUNTER — Other Ambulatory Visit: Payer: Self-pay | Admitting: Internal Medicine

## 2016-01-03 HISTORY — PX: ANTERIOR FUSION CERVICAL SPINE: SUR626

## 2016-01-07 ENCOUNTER — Other Ambulatory Visit: Payer: Self-pay | Admitting: Internal Medicine

## 2016-01-21 ENCOUNTER — Encounter: Payer: BLUE CROSS/BLUE SHIELD | Admitting: Internal Medicine

## 2016-05-20 ENCOUNTER — Encounter (INDEPENDENT_AMBULATORY_CARE_PROVIDER_SITE_OTHER): Payer: Self-pay

## 2016-05-20 ENCOUNTER — Encounter: Payer: Self-pay | Admitting: Internal Medicine

## 2016-05-20 ENCOUNTER — Ambulatory Visit (INDEPENDENT_AMBULATORY_CARE_PROVIDER_SITE_OTHER): Payer: BLUE CROSS/BLUE SHIELD | Admitting: Internal Medicine

## 2016-05-20 VITALS — BP 114/80 | HR 68 | Resp 16 | Ht 70.0 in | Wt 258.0 lb

## 2016-05-20 DIAGNOSIS — G4733 Obstructive sleep apnea (adult) (pediatric): Secondary | ICD-10-CM

## 2016-05-20 DIAGNOSIS — I10 Essential (primary) hypertension: Secondary | ICD-10-CM | POA: Diagnosis not present

## 2016-05-20 DIAGNOSIS — L609 Nail disorder, unspecified: Secondary | ICD-10-CM | POA: Diagnosis not present

## 2016-05-20 DIAGNOSIS — Z981 Arthrodesis status: Secondary | ICD-10-CM | POA: Diagnosis not present

## 2016-05-20 DIAGNOSIS — E785 Hyperlipidemia, unspecified: Secondary | ICD-10-CM

## 2016-05-20 DIAGNOSIS — Z125 Encounter for screening for malignant neoplasm of prostate: Secondary | ICD-10-CM | POA: Diagnosis not present

## 2016-05-20 DIAGNOSIS — Z0000 Encounter for general adult medical examination without abnormal findings: Secondary | ICD-10-CM | POA: Diagnosis not present

## 2016-05-20 DIAGNOSIS — M542 Cervicalgia: Secondary | ICD-10-CM

## 2016-05-20 DIAGNOSIS — G609 Hereditary and idiopathic neuropathy, unspecified: Secondary | ICD-10-CM

## 2016-05-20 DIAGNOSIS — Z23 Encounter for immunization: Secondary | ICD-10-CM

## 2016-05-20 DIAGNOSIS — M1A0790 Idiopathic chronic gout, unspecified ankle and foot, without tophus (tophi): Secondary | ICD-10-CM | POA: Diagnosis not present

## 2016-05-20 DIAGNOSIS — I2510 Atherosclerotic heart disease of native coronary artery without angina pectoris: Secondary | ICD-10-CM | POA: Diagnosis not present

## 2016-05-20 DIAGNOSIS — R200 Anesthesia of skin: Secondary | ICD-10-CM | POA: Insufficient documentation

## 2016-05-20 DIAGNOSIS — Z1159 Encounter for screening for other viral diseases: Secondary | ICD-10-CM | POA: Diagnosis not present

## 2016-05-20 DIAGNOSIS — R202 Paresthesia of skin: Secondary | ICD-10-CM

## 2016-05-20 DIAGNOSIS — G8929 Other chronic pain: Secondary | ICD-10-CM | POA: Insufficient documentation

## 2016-05-20 LAB — POCT URINALYSIS DIPSTICK
Bilirubin, UA: NEGATIVE
GLUCOSE UA: NEGATIVE
Ketones, UA: NEGATIVE
Leukocytes, UA: NEGATIVE
NITRITE UA: NEGATIVE
PH UA: 6.5
PROTEIN UA: NEGATIVE
RBC UA: NEGATIVE
SPEC GRAV UA: 1.01
UROBILINOGEN UA: 0.2

## 2016-05-20 NOTE — Progress Notes (Signed)
Date:  05/20/2016   Name:  Brian Payne   DOB:  09/26/1955   MRN:  [id number]   Chief Complaint: Annual Exam Brian Payne is a 60 y.o. male who presents today for his Complete Annual Exam. He feels fairly well. He reports exercising none. He reports he is sleeping fairly well. Using CPAP nightly at 10 cm H2O nasal mask.  Hypertension  This is a chronic problem. The problem is controlled. Associated symptoms include neck pain. Pertinent negatives include no chest pain, headaches, palpitations or shortness of breath. (Lightheadedness going from bending over to standing, esp after working in the yard.  Usually sits down and feels fine after 15 minutes.) Past treatments include angiotensin blockers and beta blockers. The current treatment provides significant improvement.  Hyperlipidemia  Pertinent negatives include no chest pain, myalgias or shortness of breath.   CAD - s/p PTCA and stent placement in 2012.  Has not been back to cardiology since 2014.  He is on pravastatin after not tolerating crestor and lipitor.  Gout - no recent flares.  On daily allopurinol.  Neuropathy - chronic and stable.  Sx did not improve with C spine fusion.  He continues on relatively low dose gabapentin but is not eager to increase the dose.  Neck pain - had ant cervical fusion 4 months ago.  Was on high dose oxycodone for a while then cut off abruptly.  The numbness in hands has not changed.  He is on gabapentin and tizanidine at night.  He is wondering about other medication that might reduce his pain.   Review of Systems  Constitutional: Negative for appetite change, chills, diaphoresis, fatigue and unexpected weight change.  HENT: Positive for congestion (nasal congestion with cpap). Negative for hearing loss, tinnitus, trouble swallowing and voice change.   Eyes: Negative for visual disturbance.  Respiratory: Negative for choking, shortness of breath and wheezing.   Cardiovascular: Negative for  chest pain, palpitations and leg swelling.       Can not get HR above 100 with exercise  Gastrointestinal: Negative for abdominal pain, blood in stool, constipation and diarrhea.  Endocrine: Negative for polydipsia and polyuria.  Genitourinary: Negative for difficulty urinating, dysuria and frequency.  Musculoskeletal: Positive for arthralgias, neck pain and neck stiffness. Negative for back pain and myalgias.  Skin: Negative for color change and rash.       Nail changes on left great and second toes  Neurological: Positive for light-headedness. Negative for dizziness, syncope and headaches.  Hematological: Negative for adenopathy.  Psychiatric/Behavioral: Negative for dysphoric mood and sleep disturbance.    Patient Active Problem List   Diagnosis Date Noted  . Mild carotid artery disease ([place]) 02/25/2015  . Tinnitus of left ear 02/12/2015  . Cervical disc disease 11/20/2014  . Tubular adenoma of colon 11/20/2014  . 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 Dyslipidemia 11/20/2014  . ED (erectile dysfunction) of organic origin 11/20/2014  . Essential (primary) hypertension 11/20/2014  . Idiopathic peripheral neuropathy 11/20/2014  . Obstructive apnea 10/08/2011  . Arteriosclerosis of coronary artery 10/08/2011    Prior to Admission medications   Medication Sig Start Date End Date Taking? Authorizing Provider  allopurinol (ZYLOPRIM) 300 MG tablet Take 2 tablets by mouth daily.   Yes Historical Provider, MD  aspirin 81 MG chewable tablet Chew 1 tablet by mouth daily.   Yes Historical Provider, MD  b complex vitamins tablet Take 1 tablet by mouth daily.   Yes Historical  Provider, MD  gabapentin (NEURONTIN) 100 MG capsule TAKE ONE CAPSULE BY MOUTH EVERY MORNING AND 4 CAPS AT BEDTIME 02/06/16  Yes Historical Provider, MD  GLUCOSAMINE HCL PO Take by mouth. 09/26/10  Yes Historical Provider, MD  losartan (COZAAR) 100 MG tablet Take 1 tablet (100 mg total) by mouth  daily. Patient taking differently: Take 50 mg by mouth daily.  01/29/15  Yes Glean Hess, MD  meloxicam (MOBIC) 7.5 MG tablet Take 1 tablet by mouth daily. 09/19/15  Yes Historical Provider, MD  metoprolol succinate (TOPROL-XL) 25 MG 24 hr tablet TAKE 1 TABLET BY MOUTH EVERY DAY 11/14/15  Yes Glean Hess, MD  Multiple Vitamin (MULTIVITAMIN) capsule Take 1 capsule by mouth daily.   Yes Historical Provider, MD  nitroGLYCERIN (NITROSTAT) 0.4 MG SL tablet Place 1 tablet under tongue as needed for chest pain (may repeat every 5 minutes but seek medical help if pain persists after 3 tablets) as needed 10/23/10  Yes Historical Provider, MD  Omega-3 Fatty Acids (FISH OIL) 1200 MG CAPS Take by mouth.   Yes Historical Provider, MD  omeprazole (PRILOSEC) 20 MG capsule Take 20 mg by mouth daily.   Yes Historical Provider, MD  pravastatin (PRAVACHOL) 20 MG tablet Take by mouth.   Yes Historical Provider, MD  tiZANidine (ZANAFLEX) 2 MG tablet Take by mouth. 05/05/16 08/03/16 Yes Historical Provider, MD    Allergies  Allergen Reactions  . Statins Other (See Comments)    Muscle aches  . Levofloxacin     dizziness  . Codeine Itching and Rash    Past Surgical History:  Procedure Laterality Date  . ANTERIOR FUSION CERVICAL SPINE  01/2016  . BILATERAL CARPAL TUNNEL RELEASE  09/2013  . COLONOSCOPY  08/14/2010   TA - rec 3 yr follow up Roxy Manns)  . CORONARY ANGIOPLASTY WITH STENT PLACEMENT  [id number]   [place]  . HYDROCELE EXCISION    . VASECTOMY      Social History  Substance Use Topics  . Smoking status: Never Smoker  . Smokeless tobacco: Never Used  . Alcohol use No     Medication list has been reviewed and updated.   Physical Exam  Constitutional: He is oriented to person, place, and time. He appears well-developed and well-nourished.  HENT:  Head: Normocephalic.  Right Ear: Tympanic membrane, external ear and ear canal normal.  Left Ear: Tympanic membrane, external ear and ear canal  normal.  Nose: Nose normal.  Mouth/Throat: Uvula is midline and oropharynx is clear and moist.  Eyes: Conjunctivae and EOM are normal. Pupils are equal, round, and reactive to light.  Neck: Phonation normal. Muscular tenderness present. Decreased range of motion present. No thyromegaly present.  Cardiovascular: Normal rate, regular rhythm, normal heart sounds and intact distal pulses.   Pulmonary/Chest: Effort normal and breath sounds normal. He has no decreased breath sounds. He has no wheezes. He has no rhonchi. Right breast exhibits no mass. Left breast exhibits no mass.  Abdominal: Soft. Normal appearance and bowel sounds are normal. There is no hepatosplenomegaly. There is no tenderness.  Musculoskeletal: He exhibits no edema.       Right knee: He exhibits swelling. He exhibits no effusion.       Left knee: He exhibits no swelling and no effusion.       Cervical back: He exhibits decreased range of motion and tenderness.  Lymphadenopathy:    He has no cervical adenopathy.  Neurological: He is alert and oriented to person, place, and time. No  cranial nerve deficit.  Reflex Scores:      Bicep reflexes are 1+ on the right side and 2+ on the left side.      Patellar reflexes are 1+ on the right side and 1+ on the left side. Skin: Skin is warm, dry and intact.  Curved, thickened nails on left great and second toes - nails actually growing inward  Psychiatric: He has a normal mood and affect. His speech is normal and behavior is normal. Judgment and thought content normal.  Nursing note and vitals reviewed.   BP 114/80   Pulse 68   Resp 16   Ht 5\' 10"  (1.778 m)   Wt 258 lb (117 kg)   SpO2 97%   BMI 37.02 kg/m   Assessment and Plan: 1. Annual physical exam Continue healthy diet and exercise Defer TDaP today  2. Essential (primary) hypertension With mild orthostatic sx - cut toprol in half for one week and d/c if sx persist Monitor BP and HR regularly - CBC with  Differential/Platelet - Comprehensive metabolic panel - POCT urinalysis dipstick  3. Idiopathic peripheral neuropathy Continue gabapentin  4. Idiopathic chronic gout of foot without tophus, unspecified laterality On allopurinol daily - Uric acid  5. Dyslipidemia Continue pravastatin, consider Praluent if LDL not at goal - Lipid panel  6. Need for hepatitis C screening test - Hepatitis C antibody  7. Prostate cancer screening DRE deferred to lack of sx - PSA  8. Need for influenza vaccination - Flu Vaccine QUAD 36+ mos IM  9. Status post cervical spinal fusion Long discussion regarding recovery and pain management  May try OTC lidoderm patches; continue Mobic; advised I can not prescribe narcotics   10. Obstructive apnea Doing well with excellent CPAP compliance Form completed  11. Nail abnormality Not currently painful or infected - may need Podiatry consult if complications arise  12. Arteriosclerosis of coronary artery Stable; continue aspirin and statin Would prefer to remain on low dose beta blocker so may need to adjust ARB if he remains sx  I spent 65 minutes with patient on this encounter.  More than 50% of time was spent in face to face education, medication management, counseling and care coordination.   Halina Maidens, MD [facility]  05/20/2016

## 2016-05-20 NOTE — Patient Instructions (Addendum)
Hold metoprolol - monitor heart rate and episodes of lightheadedness  See Podiatry for nail issues if desired

## 2016-05-21 LAB — COMPREHENSIVE METABOLIC PANEL
ALK PHOS: 70 IU/L (ref 39–117)
ALT: 28 IU/L (ref 0–44)
AST: 27 IU/L (ref 0–40)
Albumin/Globulin Ratio: 2.2 (ref 1.2–2.2)
Albumin: 4.6 g/dL (ref 3.6–4.8)
BUN/Creatinine Ratio: 17 (ref 10–24)
BUN: 24 mg/dL (ref 8–27)
Bilirubin Total: 0.5 mg/dL (ref 0.0–1.2)
CHLORIDE: 108 mmol/L — AB (ref 96–106)
CO2: 23 mmol/L (ref 18–29)
CREATININE: 1.38 mg/dL — AB (ref 0.76–1.27)
Calcium: 9.7 mg/dL (ref 8.6–10.2)
GFR calc Af Amer: 64 mL/min/{1.73_m2} (ref >59)
GFR calc non Af Amer: 55 mL/min/{1.73_m2} — ABNORMAL LOW (ref >59)
GLOBULIN, TOTAL: 2.1 g/dL (ref 1.5–4.5)
Glucose: 95 mg/dL (ref 65–99)
POTASSIUM: 5 mmol/L (ref 3.5–5.2)
SODIUM: 146 mmol/L — AB (ref 134–144)
Total Protein: 6.7 g/dL (ref 6.0–8.5)

## 2016-05-21 LAB — CBC WITH DIFFERENTIAL/PLATELET
BASOS: 0 %
Basophils Absolute: 0 10*3/uL (ref 0.0–0.2)
EOS (ABSOLUTE): 0.2 10*3/uL (ref 0.0–0.4)
EOS: 4 %
HEMATOCRIT: 38.9 % (ref 37.5–51.0)
HEMOGLOBIN: 13.3 g/dL (ref 12.6–17.7)
Immature Grans (Abs): 0 10*3/uL (ref 0.0–0.1)
Immature Granulocytes: 0 %
LYMPHS ABS: 1.6 10*3/uL (ref 0.7–3.1)
Lymphs: 26 %
MCH: 32 pg (ref 26.6–33.0)
MCHC: 34.2 g/dL (ref 31.5–35.7)
MCV: 94 fL (ref 79–97)
MONOCYTES: 9 %
Monocytes Absolute: 0.6 10*3/uL (ref 0.1–0.9)
NEUTROS ABS: 3.6 10*3/uL (ref 1.4–7.0)
Neutrophils: 61 %
Platelets: 142 10*3/uL — ABNORMAL LOW (ref 150–379)
RBC: 4.15 x10E6/uL (ref 4.14–5.80)
RDW: 13.8 % (ref 12.3–15.4)
WBC: 6 10*3/uL (ref 3.4–10.8)

## 2016-05-21 LAB — LIPID PANEL
CHOLESTEROL TOTAL: 148 mg/dL (ref 100–199)
Chol/HDL Ratio: 5.3 ratio units — ABNORMAL HIGH (ref 0.0–5.0)
HDL: 28 mg/dL — ABNORMAL LOW (ref >39)
LDL Calculated: 79 mg/dL (ref 0–99)
TRIGLYCERIDES: 206 mg/dL — AB (ref 0–149)
VLDL Cholesterol Cal: 41 mg/dL — ABNORMAL HIGH (ref 5–40)

## 2016-05-21 LAB — HEPATITIS C ANTIBODY

## 2016-05-21 LAB — URIC ACID: Uric Acid: 4.1 mg/dL (ref 3.7–8.6)

## 2016-05-21 LAB — PSA: Prostate Specific Ag, Serum: 1.6 ng/mL (ref 0.0–4.0)

## 2016-05-29 ENCOUNTER — Other Ambulatory Visit: Payer: Self-pay | Admitting: Unknown Physician Specialty

## 2016-05-29 DIAGNOSIS — M25562 Pain in left knee: Secondary | ICD-10-CM

## 2016-06-12 ENCOUNTER — Ambulatory Visit: Payer: BLUE CROSS/BLUE SHIELD | Admitting: Pain Medicine

## 2016-08-11 ENCOUNTER — Encounter: Payer: Self-pay | Admitting: Internal Medicine

## 2016-08-11 ENCOUNTER — Ambulatory Visit (INDEPENDENT_AMBULATORY_CARE_PROVIDER_SITE_OTHER): Payer: BLUE CROSS/BLUE SHIELD | Admitting: Internal Medicine

## 2016-08-11 VITALS — BP 122/82 | HR 86 | Temp 97.6°F | Ht 72.0 in | Wt 258.0 lb

## 2016-08-11 DIAGNOSIS — J40 Bronchitis, not specified as acute or chronic: Secondary | ICD-10-CM | POA: Diagnosis not present

## 2016-08-11 MED ORDER — DOXYCYCLINE HYCLATE 100 MG PO TABS: 100.0000 mg | ORAL_TABLET | Freq: Two times a day (BID) | ORAL | 0 refills | Status: DC

## 2016-08-11 MED ORDER — FLUTICASONE PROPIONATE 50 MCG/ACT NA SUSP: 2.0000 | Freq: Every day | NASAL | 6 refills | Status: DC

## 2016-08-11 MED ORDER — GUAIFENESIN-CODEINE 100-10 MG/5ML PO SYRP: 5.0000 mL | ORAL_SOLUTION | Freq: Three times a day (TID) | ORAL | 0 refills | Status: DC | PRN

## 2016-08-11 NOTE — Patient Instructions (Signed)
Continue Dayquil and Nyquil  Continue Afrin  Mucinex if helpful

## 2016-08-11 NOTE — Progress Notes (Signed)
Date:  08/11/2016   Name:  Brian Payne   DOB:  June 19, 1956   MRN:  [id number]   Chief Complaint: Cough (Pt stated coughing yellowish mucus and left ear pain.) Cough  This is a new problem. The current episode started in the past 7 days. The problem has been gradually worsening. The problem occurs every few minutes. The cough is productive of sputum. Associated symptoms include ear pain, nasal congestion, shortness of breath and wheezing. Pertinent negatives include no chills, fever or headaches.  Otalgia   There is pain in both ears. This is a new problem. The current episode started 1 to 4 weeks ago. The problem has been gradually improving. There has been no fever. Associated symptoms include coughing. Pertinent negatives include no ear discharge or headaches. He has tried antibiotics for the symptoms. The treatment provided moderate relief.  Recently completed course of augmentin.    Review of Systems  Constitutional: Negative for chills and fever.  HENT: Positive for ear pain. Negative for ear discharge.   Respiratory: Positive for cough, shortness of breath and wheezing.   Neurological: Negative for headaches.    Patient Active Problem List   Diagnosis Date Noted  . Status post cervical spinal fusion 05/20/2016  . Nail abnormality 05/20/2016  . Mild carotid artery disease ([place]) 02/25/2015  . Tinnitus of left ear 02/12/2015  . Cervical disc disease 11/20/2014  . Tubular adenoma of colon 11/20/2014  . 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 Dyslipidemia 11/20/2014  . ED (erectile dysfunction) of organic origin 11/20/2014  . Essential (primary) hypertension 11/20/2014  . Idiopathic peripheral neuropathy 11/20/2014  . Obstructive apnea 10/08/2011  . Arteriosclerosis of coronary artery 10/08/2011    Prior to Admission medications   Medication Sig Start Date End Date Taking? Authorizing Provider  allopurinol (ZYLOPRIM) 300 MG tablet Take 2 tablets by  mouth daily.   Yes Historical Provider, MD  aspirin 81 MG chewable tablet Chew 1 tablet by mouth daily.   Yes Historical Provider, MD  b complex vitamins tablet Take 1 tablet by mouth daily.   Yes Historical Provider, MD  gabapentin (NEURONTIN) 100 MG capsule TAKE ONE CAPSULE BY MOUTH EVERY MORNING AND 4 CAPS AT BEDTIME 02/06/16  Yes Historical Provider, MD  GLUCOSAMINE HCL PO Take by mouth. 09/26/10  Yes Historical Provider, MD  losartan (COZAAR) 100 MG tablet Take 1 tablet (100 mg total) by mouth daily. Patient taking differently: Take 50 mg by mouth daily.  01/29/15  Yes Glean Hess, MD  meloxicam (MOBIC) 7.5 MG tablet Take 1 tablet by mouth daily. 09/19/15  Yes Historical Provider, MD  metoprolol succinate (TOPROL-XL) 25 MG 24 hr tablet TAKE 1 TABLET BY MOUTH EVERY DAY 11/14/15  Yes Glean Hess, MD  Multiple Vitamin (MULTIVITAMIN) capsule Take 1 capsule by mouth daily.   Yes Historical Provider, MD  Omega-3 Fatty Acids (FISH OIL) 1200 MG CAPS Take by mouth.   Yes Historical Provider, MD  pravastatin (PRAVACHOL) 20 MG tablet Take by mouth.   Yes Historical Provider, MD  nitroGLYCERIN (NITROSTAT) 0.4 MG SL tablet Place 1 tablet under tongue as needed for chest pain (may repeat every 5 minutes but seek medical help if pain persists after 3 tablets) as needed 10/23/10   Historical Provider, MD    Allergies  Allergen Reactions  . Statins Other (See Comments)    Muscle aches  . Levofloxacin     dizziness  . Codeine Itching and Rash  Past Surgical History:  Procedure Laterality Date  . ANTERIOR FUSION CERVICAL SPINE  01/2016  . BILATERAL CARPAL TUNNEL RELEASE  09/2013  . COLONOSCOPY  08/14/2010   TA - rec 3 yr follow up Roxy Manns)  . CORONARY ANGIOPLASTY WITH STENT PLACEMENT  [id number]   [place]  . HYDROCELE EXCISION    . VASECTOMY      Social History  Substance Use Topics  . Smoking status: Never Smoker  . Smokeless tobacco: Never Used  . Alcohol use No     Medication list has  been reviewed and updated.   Physical Exam  Constitutional: He is oriented to person, place, and time. He appears well-developed and well-nourished.  HENT:  Right Ear: External ear and ear canal normal. Tympanic membrane is retracted. Tympanic membrane is not erythematous.  Left Ear: External ear and ear canal normal. Tympanic membrane is retracted. Tympanic membrane is not erythematous.  Nose: Right sinus exhibits no maxillary sinus tenderness and no frontal sinus tenderness. Left sinus exhibits no maxillary sinus tenderness and no frontal sinus tenderness.  Mouth/Throat: Uvula is midline and mucous membranes are normal. No oral lesions. Posterior oropharyngeal erythema present. No oropharyngeal exudate.  Cardiovascular: Normal rate, regular rhythm and normal heart sounds.   Pulmonary/Chest: No accessory muscle usage. No respiratory distress. He has decreased breath sounds. He has no wheezes. He has rhonchi. He has no rales.  Lymphadenopathy:    He has no cervical adenopathy.  Neurological: He is alert and oriented to person, place, and time.    BP 122/82   Pulse 86   Temp 97.6 F (36.4 C)   Ht 6' (1.829 m)   Wt 258 lb (117 kg)   SpO2 98%   BMI 34.99 kg/m   Assessment and Plan: 1. Bronchitis Continue dayquil and nyquil as needed flonase for eustachian tube dysfunction and ear fullness - doxycycline (VIBRA-TABS) 100 MG tablet; Take 1 tablet (100 mg total) by mouth 2 (two) times daily.  Dispense: 20 tablet; Refill: 0 - fluticasone (FLONASE) 50 MCG/ACT nasal spray; Place 2 sprays into both nostrils daily.  Dispense: 16 g; Refill: 6 - guaiFENesin-codeine (ROBITUSSIN AC) 100-10 MG/5ML syrup; Take 5 mLs by mouth 3 (three) times daily as needed for cough.  Dispense: 150 mL; Refill: 0   Halina Maidens, MD [facility]  08/11/2016

## 2016-11-19 ENCOUNTER — Other Ambulatory Visit: Payer: Self-pay | Admitting: Internal Medicine

## 2017-01-15 ENCOUNTER — Other Ambulatory Visit: Payer: Self-pay | Admitting: Internal Medicine

## 2017-03-22 ENCOUNTER — Ambulatory Visit (INDEPENDENT_AMBULATORY_CARE_PROVIDER_SITE_OTHER): Payer: BLUE CROSS/BLUE SHIELD

## 2017-03-22 ENCOUNTER — Encounter: Payer: Self-pay | Admitting: Gynecology

## 2017-03-22 ENCOUNTER — Ambulatory Visit
Admission: EM | Admit: 2017-03-22 | Discharge: 2017-03-22 | Disposition: A | Discharge status: Home / Self Care | Payer: BLUE CROSS/BLUE SHIELD | Attending: Family Medicine | Admitting: Family Medicine

## 2017-03-22 DIAGNOSIS — S46211A Strain of muscle, fascia and tendon of other parts of biceps, right arm, initial encounter: Secondary | ICD-10-CM | POA: Diagnosis not present

## 2017-03-22 DIAGNOSIS — M79601 Pain in right arm: Secondary | ICD-10-CM

## 2017-03-22 HISTORY — DX: Presence of coronary angioplasty implant and graft: Z95.5

## 2017-03-22 HISTORY — DX: Gout, unspecified: M10.9

## 2017-03-22 HISTORY — DX: Cervicalgia: M54.2

## 2017-03-22 HISTORY — DX: Unspecified osteoarthritis, unspecified site: M19.90

## 2017-03-22 HISTORY — DX: Essential (primary) hypertension: I10

## 2017-03-22 HISTORY — DX: Neuralgia and neuritis, unspecified: M79.2

## 2017-03-22 HISTORY — DX: Other chronic pain: G89.29

## 2017-03-22 MED ORDER — MELOXICAM 15 MG PO TABS: 15.0000 mg | ORAL_TABLET | Freq: Every day | ORAL | 0 refills | Status: DC

## 2017-03-22 NOTE — ED Provider Notes (Signed)
[facility]    CSN: [id number] Arrival date & time: 03/22/17  1004     History   Chief Complaint Chief Complaint  Patient presents with  . Arm Injury    HPI AYDAN LEVITZ is a 61 y.o. male.   Patient is a 61 year old white male who comes in with right arm pain. He was moving a door which got caught on something and as he was moving he felt a pop in his right arm he subsequently has had pain in the right arm swelling and bruising on the medial aspect of the inner. When he flexes his arm he is no swelling that goes up into his arm. He reports some discomfort he has a history of arthritis gout coronary artery disease with stents hypertension and nerve pain. He's had cervical spine fusion nail abnormalities and coronary artery procedures done. He also has a history of hyperlipidemia ED and idiopathic peripheral neuropathy. He's had a hydrocele excision and a vasectomy and colonoscopy and bilateral carpal tunnel release in the past as well. He is allergic to Levaquin codeine and statins. Is a history of arthritis in the family and he does not smoke.   The history is provided by the patient and the spouse. No language interpreter was used.  Arm Injury  Location:  Arm Arm location:  R arm, R forearm and R upper arm Injury: yes   Time since incident:  1 day Pain details:    Quality:  Shooting and throbbing   Severity:  Moderate   Onset quality:  Sudden   Timing:  Constant   Progression:  Waxing and waning Handedness:  Right-handed Dislocation: no   Foreign body present:  No foreign bodies   Past Medical History:  Diagnosis Date  . Arthritis   . Chronic neck pain   . Gout   . H/O heart artery stent   . Hypertension   . Hypertension   . Nerve pain     Patient Active Problem List   Diagnosis Date Noted  . Status post cervical spinal fusion 05/20/2016  . Nail abnormality 05/20/2016  . Mild carotid artery disease ([place]) 02/25/2015  . Tinnitus of left ear  02/12/2015  . Cervical disc disease 11/20/2014  . Tubular adenoma of colon 11/20/2014  . 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 Dyslipidemia 11/20/2014  . ED (erectile dysfunction) of organic origin 11/20/2014  . Essential (primary) hypertension 11/20/2014  . Idiopathic peripheral neuropathy 11/20/2014  . Obstructive apnea 10/08/2011  . Arteriosclerosis of coronary artery 10/08/2011    Past Surgical History:  Procedure Laterality Date  . ANTERIOR FUSION CERVICAL SPINE  01/2016  . BILATERAL CARPAL TUNNEL RELEASE  09/2013  . COLONOSCOPY  08/14/2010   TA - rec 3 yr follow up Roxy Manns)  . CORONARY ANGIOPLASTY WITH STENT PLACEMENT  [id number]   [place]  . HYDROCELE EXCISION    . VASECTOMY         Home Medications    Prior to Admission medications   Medication Sig Start Date End Date Taking? Authorizing Provider  allopurinol (ZYLOPRIM) 300 MG tablet Take 2 tablets by mouth daily.   Yes [provider]  aspirin 81 MG chewable tablet Chew 1 tablet by mouth daily.   Yes [provider]  b complex vitamins tablet Take 1 tablet by mouth daily.   Yes [provider]  fluticasone (FLONASE) 50 MCG/ACT nasal spray Place 2 sprays into both nostrils daily. 08/11/16  Yes Halina Maidens  H, MD  gabapentin (NEURONTIN) 100 MG capsule TAKE ONE CAPSULE BY MOUTH EVERY MORNING AND 4 CAPS AT BEDTIME 02/06/16  Yes [provider]  GLUCOSAMINE HCL PO Take by mouth. 09/26/10  Yes [provider]  HYDROcodone-acetaminophen (NORCO) 7.5-325 MG tablet Take 1 tablet by mouth every 6 (six) hours as needed for moderate pain.   Yes [provider]  losartan (COZAAR) 100 MG tablet Take 1 tablet (100 mg total) by mouth daily. Patient taking differently: Take 50 mg by mouth daily.  01/29/15  Yes Glean Hess, MD  losartan (COZAAR) 100 MG tablet TAKE 1 TABLET (100 MG TOTAL) BY MOUTH DAILY. 01/15/17  Yes Glean Hess, MD  meloxicam (MOBIC) 7.5 MG  tablet Take 1 tablet by mouth daily. 09/19/15  Yes [provider]  metoprolol succinate (TOPROL-XL) 25 MG 24 hr tablet TAKE 1 TABLET BY MOUTH EVERY DAY 11/19/16  Yes Glean Hess, MD  Multiple Vitamin (MULTIVITAMIN) capsule Take 1 capsule by mouth daily.   Yes [provider]  nitroGLYCERIN (NITROSTAT) 0.4 MG SL tablet Place 1 tablet under tongue as needed for chest pain (may repeat every 5 minutes but seek medical help if pain persists after 3 tablets) as needed 10/23/10  Yes [provider]  Omega-3 Fatty Acids (FISH OIL) 1200 MG CAPS Take by mouth.   Yes [provider]  pravastatin (PRAVACHOL) 20 MG tablet Take by mouth.   Yes [provider]  doxycycline (VIBRA-TABS) 100 MG tablet Take 1 tablet (100 mg total) by mouth 2 (two) times daily. 08/11/16   Glean Hess, MD  guaiFENesin-codeine [facility]) 100-10 MG/5ML syrup Take 5 mLs by mouth 3 (three) times daily as needed for cough. 08/11/16   Glean Hess, MD    Family History Family History  Problem Relation Age of Onset  . Rheum arthritis Mother   . Rheumatic fever Father     Social History Social History  Substance Use Topics  . Smoking status: Never Smoker  . Smokeless tobacco: Never Used  . Alcohol use No     Allergies   Statins; Levofloxacin; and Codeine   Review of Systems Review of Systems  Musculoskeletal: Positive for joint swelling and myalgias.     Physical Exam Triage Vital Signs ED Triage Vitals  Enc Vitals Group     BP 03/22/17 1045 (!) 141/81     Pulse Rate 03/22/17 1045 73     Resp 03/22/17 1045 16     Temp 03/22/17 1045 98.3 F (36.8 C)     Temp Source 03/22/17 1045 Oral     SpO2 03/22/17 1045 98 %     Weight 03/22/17 1039 255 lb (115.7 kg)     Height 03/22/17 1039 6' (1.829 m)     Head Circumference --      Peak Flow --      Pain Score 03/22/17 1039 4     Pain Loc --      Pain Edu? --      Excl. in [place]? --    No data  found.   Updated Vital Signs BP (!) 141/81 (BP Location: Left Arm)   Pulse 73   Temp 98.3 F (36.8 C) (Oral)   Resp 16   Ht 6' (1.829 m)   Wt 255 lb (115.7 kg)   SpO2 98%   BMI 34.58 kg/m   Visual Acuity Right Eye Distance:   Left Eye Distance:   Bilateral Distance:  Right Eye Near:   Left Eye Near:    Bilateral Near:     Physical Exam  Constitutional: He is oriented to person, place, and time. He appears well-developed and well-nourished. No distress.  HENT:  Head: Normocephalic and atraumatic.  Eyes: Pupils are equal, round, and reactive to light.  Neck: Normal range of motion. Neck supple.  Musculoskeletal: He exhibits tenderness.  Neurological: He is alert and oriented to person, place, and time.  Skin: He is not diaphoretic. There is erythema.     Patient has ecchymosis over the elbow and the medial side in proximal to that is a swelling and tenderness along that area which think is consistent with a partial biceps tear  Psychiatric: He has a normal mood and affect.  Vitals reviewed.    UC Treatments / Results  Labs (all labs ordered are listed, but only abnormal results are displayed) Labs Reviewed - No data to display  EKG  EKG Interpretation None       Radiology No results found.  Procedures Procedures (including critical care time)  Medications Ordered in UC Medications - No data to display   Initial Impression / Assessment and Plan / UC Course  I have reviewed the triage vital signs and the nursing notes.  Pertinent labs & imaging results that were available during my care of the patient were reviewed by me and considered in my medical decision making (see chart for details).   patient has an orthopedic but wasn't happy with the answering service for Sunday he injured his arm on Saturday. X-ray done just to make sure there is no signs of crack bone but going to strongly suggest the patient see his orthopedic either Monday or Tuesday to  be reevaluated. Explained to him and his wife I think he has a partial tear she's worried about work I can give him a note for work for Sunday Monday and Tuesday but he'll have to have any further notes to be out from his orthopedic. We'll provide a sling recommend ice and [address] Mobic 15 mg use needed for pain.  Final Clinical Impressions(s) / UC Diagnoses   Final diagnoses:  None   New Prescriptions New Prescriptions   No medications on file   Note: This dictation was prepared with Dragon dictation along with smaller phrase technology. Any transcriptional errors that result from this process are unintentional. Controlled Substance Prescriptions [place] Controlled Substance Registry consulted? Not Applicable   Frederich Cha, MD 03/22/17 1207

## 2017-03-22 NOTE — ED Triage Notes (Signed)
Per patient x yesterday while ;moving a pull barn door heard a snap in his right arm.

## 2017-03-25 ENCOUNTER — Other Ambulatory Visit: Payer: Self-pay | Admitting: Specialist

## 2017-03-26 ENCOUNTER — Other Ambulatory Visit: Payer: Self-pay

## 2017-03-27 ENCOUNTER — Encounter
Admission: RE | Admit: 2017-03-27 | Discharge: 2017-03-27 | Disposition: A | Discharge status: Home / Self Care | Payer: BLUE CROSS/BLUE SHIELD | Source: Ambulatory Visit | Attending: Specialist | Admitting: Specialist

## 2017-03-27 DIAGNOSIS — E785 Hyperlipidemia, unspecified: Secondary | ICD-10-CM | POA: Diagnosis not present

## 2017-03-27 DIAGNOSIS — Z79899 Other long term (current) drug therapy: Secondary | ICD-10-CM | POA: Diagnosis not present

## 2017-03-27 DIAGNOSIS — Y929 Unspecified place or not applicable: Secondary | ICD-10-CM | POA: Diagnosis not present

## 2017-03-27 DIAGNOSIS — M109 Gout, unspecified: Secondary | ICD-10-CM | POA: Diagnosis not present

## 2017-03-27 DIAGNOSIS — I10 Essential (primary) hypertension: Secondary | ICD-10-CM | POA: Diagnosis not present

## 2017-03-27 DIAGNOSIS — K219 Gastro-esophageal reflux disease without esophagitis: Secondary | ICD-10-CM | POA: Diagnosis not present

## 2017-03-27 DIAGNOSIS — S46211A Strain of muscle, fascia and tendon of other parts of biceps, right arm, initial encounter: Secondary | ICD-10-CM | POA: Diagnosis present

## 2017-03-27 DIAGNOSIS — X500XXA Overexertion from strenuous movement or load, initial encounter: Secondary | ICD-10-CM | POA: Diagnosis not present

## 2017-03-27 DIAGNOSIS — G4733 Obstructive sleep apnea (adult) (pediatric): Secondary | ICD-10-CM | POA: Diagnosis not present

## 2017-03-27 DIAGNOSIS — Z955 Presence of coronary angioplasty implant and graft: Secondary | ICD-10-CM | POA: Diagnosis not present

## 2017-03-27 DIAGNOSIS — I2510 Atherosclerotic heart disease of native coronary artery without angina pectoris: Secondary | ICD-10-CM | POA: Diagnosis not present

## 2017-03-27 HISTORY — DX: Sleep apnea, unspecified: G47.30

## 2017-03-27 HISTORY — DX: Atherosclerotic heart disease of native coronary artery without angina pectoris: I25.10

## 2017-03-27 HISTORY — DX: Personal history of other diseases of the digestive system: Z87.19

## 2017-03-27 LAB — CBC
HCT: 37.3 % — ABNORMAL LOW (ref 40.0–52.0)
Hemoglobin: 12.9 g/dL — ABNORMAL LOW (ref 13.0–18.0)
MCH: 32.3 pg (ref 26.0–34.0)
MCHC: 34.5 g/dL (ref 32.0–36.0)
MCV: 93.6 fL (ref 80.0–100.0)
PLATELETS: 164 10*3/uL (ref 150–440)
RBC: 3.99 MIL/uL — ABNORMAL LOW (ref 4.40–5.90)
RDW: 13.7 % (ref 11.5–14.5)
WBC: 7.1 10*3/uL (ref 3.8–10.6)

## 2017-03-27 NOTE — Patient Instructions (Signed)
Your procedure is scheduled QP:YPPJKD 27,2018 AT 11:30AM Report to Same Day Surgery on the 2nd floor in the [place].   REMEMBER: Instructions that are not followed completely may result in serious medical risk up to and including death; or upon the discretion of your surgeon and anesthesiologist your surgery may need to be rescheduled.  Do not eat food or drink liquids after midnight. No gum chewing or hard candies (40Z CLEAR LIQUIDS UP TO 2HR BEFORE ARRIVAL TIME)  No Alcohol for 24 hours before or after surgery.  No Smoking for 24 hours prior to surgery.  Notify your doctor if there is any change in your medical condition (cold, fever, infection).  Do not wear jewelry, make-up, hairpins, clips or nail polish.  Do not wear lotions, powders, or perfumes.   Do not shave 48 hours prior to surgery. Men may shave face and neck.  Contacts and dentures may not be worn into surgery.  Do not bring valuables to the hospital. [facility] is not responsible for any belongings or valuables.   TAKE THESE MEDICATIONS THE MORNING OF SURGERY WITH A SIP OF WATER: METOPROLOL PRAVASTATIN   USE FLONASE AS DIRECTED  Use CHG Soap or wipes as directed on instruction sheet.  Bring your C-PAP to the hospital with you in case you may have to spend the night.  Follow recommendations from Cardiologist, Pulmonologist or PCP regarding stopping Aspirin, Coumadin, Plavix, Eliquis, Pradaxa, or Pletal. NOT TAKING ASPIRIN UNTIL AFTER SURGERY   Stop Anti-inflammatories such as Advil, Aleve, Ibuprofen, Motrin, Naproxen, Naprosyn, Goodie powder, or aspirin products. (May take Tylenol or Acetaminophen and Celebrex if needed.)PT INSTRUCTED THAT HE COULD TAKE MELOXICAM BY SHERRY AT OFFICE  Stop supplements until after surgery. (May continue Vitamin D, Vitamin B, and multivitamin.)STOP GINKGO BILOBA, GLUCOSAMINE, FISH OIL   If you are being admitted to the hospital overnight, leave your suitcase in the car.  After surgery it may be brought to your room.  If you are being discharged the day of surgery, you will not be allowed to drive home. You will need someone to drive you home and stay with you that night.   If you are taking public transportation, you will need to have a responsible adult to with you.

## 2017-03-29 MED ORDER — CEFAZOLIN SODIUM-DEXTROSE 2-4 GM/100ML-% IV SOLN
2.0000 g | INTRAVENOUS | Status: AC
  Administered 2017-03-30: 2 g via INTRAVENOUS

## 2017-03-29 MED ORDER — CLINDAMYCIN PHOSPHATE 600 MG/50ML IV SOLN
600.0000 mg | Freq: Once | INTRAVENOUS | Status: AC
  Administered 2017-03-30: 600 mg via INTRAVENOUS

## 2017-03-30 ENCOUNTER — Ambulatory Visit: Payer: BLUE CROSS/BLUE SHIELD | Admitting: Anesthesiology

## 2017-03-30 ENCOUNTER — Encounter: Payer: Self-pay | Admitting: *Deleted

## 2017-03-30 ENCOUNTER — Encounter
Admission: RE | Disposition: A | Discharge status: Home / Self Care | Payer: Self-pay | Source: Ambulatory Visit | Attending: Specialist

## 2017-03-30 ENCOUNTER — Ambulatory Visit
Admission: RE | Admit: 2017-03-30 | Discharge: 2017-03-30 | Disposition: A | Discharge status: Home / Self Care | Payer: BLUE CROSS/BLUE SHIELD | Source: Ambulatory Visit | Attending: Specialist | Admitting: Specialist

## 2017-03-30 DIAGNOSIS — E785 Hyperlipidemia, unspecified: Secondary | ICD-10-CM | POA: Insufficient documentation

## 2017-03-30 DIAGNOSIS — I2510 Atherosclerotic heart disease of native coronary artery without angina pectoris: Secondary | ICD-10-CM | POA: Insufficient documentation

## 2017-03-30 DIAGNOSIS — M109 Gout, unspecified: Secondary | ICD-10-CM | POA: Insufficient documentation

## 2017-03-30 DIAGNOSIS — K219 Gastro-esophageal reflux disease without esophagitis: Secondary | ICD-10-CM | POA: Insufficient documentation

## 2017-03-30 DIAGNOSIS — X500XXA Overexertion from strenuous movement or load, initial encounter: Secondary | ICD-10-CM | POA: Insufficient documentation

## 2017-03-30 DIAGNOSIS — I10 Essential (primary) hypertension: Secondary | ICD-10-CM | POA: Insufficient documentation

## 2017-03-30 DIAGNOSIS — Z79899 Other long term (current) drug therapy: Secondary | ICD-10-CM | POA: Insufficient documentation

## 2017-03-30 DIAGNOSIS — S46211A Strain of muscle, fascia and tendon of other parts of biceps, right arm, initial encounter: Secondary | ICD-10-CM | POA: Diagnosis not present

## 2017-03-30 DIAGNOSIS — G4733 Obstructive sleep apnea (adult) (pediatric): Secondary | ICD-10-CM | POA: Insufficient documentation

## 2017-03-30 DIAGNOSIS — Y929 Unspecified place or not applicable: Secondary | ICD-10-CM | POA: Insufficient documentation

## 2017-03-30 DIAGNOSIS — Z955 Presence of coronary angioplasty implant and graft: Secondary | ICD-10-CM | POA: Insufficient documentation

## 2017-03-30 HISTORY — DX: Failed or difficult intubation, initial encounter: T88.4XXA

## 2017-03-30 HISTORY — PX: DISTAL BICEPS TENDON REPAIR: SHX1461

## 2017-03-30 SURGERY — REPAIR, TENDON, BICEPS, DISTAL
Anesthesia: General | Laterality: Right

## 2017-03-30 MED ORDER — SUCCINYLCHOLINE CHLORIDE 20 MG/ML IJ SOLN
INTRAMUSCULAR | Status: DC | PRN
  Administered 2017-03-30: 140 mg via INTRAVENOUS

## 2017-03-30 MED ORDER — DEXAMETHASONE SODIUM PHOSPHATE 10 MG/ML IJ SOLN
INTRAMUSCULAR | Status: AC
  Filled 2017-03-30: qty 1

## 2017-03-30 MED ORDER — MELOXICAM 7.5 MG PO TABS
15.0000 mg | ORAL_TABLET | Freq: Once | ORAL | Status: AC
  Administered 2017-03-30: 7.5 mg via ORAL

## 2017-03-30 MED ORDER — FENTANYL CITRATE (PF) 100 MCG/2ML IJ SOLN
INTRAMUSCULAR | Status: DC | PRN
  Administered 2017-03-30 (×2): 50 ug via INTRAVENOUS

## 2017-03-30 MED ORDER — ONDANSETRON HCL 4 MG/2ML IJ SOLN
INTRAMUSCULAR | Status: DC | PRN
  Administered 2017-03-30: 4 mg via INTRAVENOUS

## 2017-03-30 MED ORDER — CEFAZOLIN SODIUM-DEXTROSE 2-4 GM/100ML-% IV SOLN
INTRAVENOUS | Status: AC
  Filled 2017-03-30: qty 100

## 2017-03-30 MED ORDER — SODIUM CHLORIDE 0.9 % IR SOLN
Status: DC | PRN
  Administered 2017-03-30: 175 mL

## 2017-03-30 MED ORDER — PHENYLEPHRINE HCL 10 MG/ML IJ SOLN
INTRAMUSCULAR | Status: DC | PRN
  Administered 2017-03-30 (×2): 100 ug via INTRAVENOUS
  Administered 2017-03-30: 150 ug via INTRAVENOUS

## 2017-03-30 MED ORDER — LIDOCAINE HCL (PF) 2 % IJ SOLN
INTRAMUSCULAR | Status: AC
  Filled 2017-03-30: qty 2

## 2017-03-30 MED ORDER — GLYCOPYRROLATE 0.2 MG/ML IJ SOLN
INTRAMUSCULAR | Status: AC
  Filled 2017-03-30: qty 1

## 2017-03-30 MED ORDER — CLINDAMYCIN PHOSPHATE 600 MG/50ML IV SOLN
INTRAVENOUS | Status: AC
  Filled 2017-03-30: qty 50

## 2017-03-30 MED ORDER — PROPOFOL 10 MG/ML IV BOLUS
INTRAVENOUS | Status: DC | PRN
  Administered 2017-03-30: 200 mg via INTRAVENOUS

## 2017-03-30 MED ORDER — MIDAZOLAM HCL 2 MG/2ML IJ SOLN
INTRAMUSCULAR | Status: DC | PRN
  Administered 2017-03-30: 2 mg via INTRAVENOUS

## 2017-03-30 MED ORDER — PROPOFOL 10 MG/ML IV BOLUS
INTRAVENOUS | Status: AC
  Filled 2017-03-30: qty 20

## 2017-03-30 MED ORDER — LACTATED RINGERS IV SOLN
INTRAVENOUS | Status: DC
  Administered 2017-03-30: 13:00:00 via INTRAVENOUS

## 2017-03-30 MED ORDER — FENTANYL CITRATE (PF) 100 MCG/2ML IJ SOLN
25.0000 ug | INTRAMUSCULAR | Status: DC | PRN
  Administered 2017-03-30 (×4): 25 ug via INTRAVENOUS

## 2017-03-30 MED ORDER — ACETAMINOPHEN 10 MG/ML IV SOLN
INTRAVENOUS | Status: AC
  Filled 2017-03-30: qty 100

## 2017-03-30 MED ORDER — FAMOTIDINE 20 MG PO TABS
20.0000 mg | ORAL_TABLET | Freq: Once | ORAL | Status: AC
  Administered 2017-03-30: 20 mg via ORAL

## 2017-03-30 MED ORDER — GABAPENTIN 400 MG PO CAPS
400.0000 mg | ORAL_CAPSULE | Freq: Once | ORAL | Status: AC
  Administered 2017-03-30: 400 mg via ORAL

## 2017-03-30 MED ORDER — ONDANSETRON HCL 4 MG/2ML IJ SOLN
INTRAMUSCULAR | Status: AC
  Filled 2017-03-30: qty 4

## 2017-03-30 MED ORDER — FENTANYL CITRATE (PF) 100 MCG/2ML IJ SOLN
INTRAMUSCULAR | Status: AC
  Administered 2017-03-30: 25 ug via INTRAVENOUS
  Filled 2017-03-30: qty 2

## 2017-03-30 MED ORDER — GABAPENTIN 400 MG PO CAPS
ORAL_CAPSULE | ORAL | Status: AC
  Administered 2017-03-30: 400 mg via ORAL
  Filled 2017-03-30: qty 1

## 2017-03-30 MED ORDER — MELOXICAM 7.5 MG PO TABS
ORAL_TABLET | ORAL | Status: AC
Start: 2017-03-30 — End: 2017-03-30
  Administered 2017-03-30: 7.5 mg via ORAL
  Filled 2017-03-30: qty 2

## 2017-03-30 MED ORDER — HYDROCODONE-ACETAMINOPHEN 7.5-325 MG PO TABS: 1.0000 | ORAL_TABLET | Freq: Four times a day (QID) | ORAL | 0 refills | Status: DC | PRN

## 2017-03-30 MED ORDER — ROCURONIUM BROMIDE 50 MG/5ML IV SOLN
INTRAVENOUS | Status: AC
  Filled 2017-03-30: qty 1

## 2017-03-30 MED ORDER — DEXAMETHASONE SODIUM PHOSPHATE 10 MG/ML IJ SOLN
INTRAMUSCULAR | Status: DC | PRN
  Administered 2017-03-30: 10 mg via INTRAVENOUS

## 2017-03-30 MED ORDER — BUPIVACAINE HCL (PF) 0.5 % IJ SOLN
INTRAMUSCULAR | Status: AC
  Filled 2017-03-30: qty 30

## 2017-03-30 MED ORDER — LIDOCAINE HCL (CARDIAC) 20 MG/ML IV SOLN
INTRAVENOUS | Status: DC | PRN
  Administered 2017-03-30: 100 mg via INTRAVENOUS

## 2017-03-30 MED ORDER — MELOXICAM 15 MG PO TABS: 15.0000 mg | ORAL_TABLET | Freq: Every day | ORAL | 3 refills | Status: DC

## 2017-03-30 MED ORDER — FAMOTIDINE 20 MG PO TABS
ORAL_TABLET | ORAL | Status: AC
  Administered 2017-03-30: 20 mg via ORAL
  Filled 2017-03-30: qty 1

## 2017-03-30 MED ORDER — BUPIVACAINE HCL (PF) 0.5 % IJ SOLN
INTRAMUSCULAR | Status: DC | PRN
  Administered 2017-03-30: 30 mL

## 2017-03-30 MED ORDER — ACETAMINOPHEN 10 MG/ML IV SOLN
INTRAVENOUS | Status: DC | PRN
  Administered 2017-03-30: 1000 mg via INTRAVENOUS

## 2017-03-30 MED ORDER — CHLORHEXIDINE GLUCONATE CLOTH 2 % EX PADS: 6.0000 | MEDICATED_PAD | Freq: Once | CUTANEOUS | Status: DC

## 2017-03-30 MED ORDER — SUCCINYLCHOLINE CHLORIDE 20 MG/ML IJ SOLN
INTRAMUSCULAR | Status: AC
  Filled 2017-03-30: qty 1

## 2017-03-30 MED ORDER — FENTANYL CITRATE (PF) 100 MCG/2ML IJ SOLN
INTRAMUSCULAR | Status: AC
  Filled 2017-03-30: qty 2

## 2017-03-30 MED ORDER — MIDAZOLAM HCL 2 MG/2ML IJ SOLN
INTRAMUSCULAR | Status: AC
  Filled 2017-03-30: qty 2

## 2017-03-30 SURGICAL SUPPLY — 45 items
BLADE CLIPPER SURG (BLADE) ×2 IMPLANT
BLADE SURG MINI STRL (BLADE) ×2 IMPLANT
BNDG COHESIVE 4X5 TAN STRL (GAUZE/BANDAGES/DRESSINGS) ×4 IMPLANT
BNDG ESMARK 4X12 TAN STRL LF (GAUZE/BANDAGES/DRESSINGS) ×2 IMPLANT
CANISTER SUCT 1200ML W/VALVE (MISCELLANEOUS) ×2 IMPLANT
CHLORAPREP W/TINT 26ML (MISCELLANEOUS) ×4 IMPLANT
CUFF TOURN 18 STER (MISCELLANEOUS) ×2 IMPLANT
CUFF TOURN 24 STER (MISCELLANEOUS) IMPLANT
DECANTER SPIKE VIAL GLASS SM (MISCELLANEOUS) ×2 IMPLANT
ELECT REM PT RETURN 9FT ADLT (ELECTROSURGICAL) ×2
ELECTRODE REM PT RTRN 9FT ADLT (ELECTROSURGICAL) ×1 IMPLANT
GAUZE PETRO XEROFOAM 1X8 (MISCELLANEOUS) ×2 IMPLANT
GAUZE SPONGE 4X4 12PLY STRL (GAUZE/BANDAGES/DRESSINGS) ×2 IMPLANT
GAUZE XEROFORM 4X4 STRL (GAUZE/BANDAGES/DRESSINGS) IMPLANT
GLOVE BIO SURGEON STRL SZ 6.5 (GLOVE) ×2 IMPLANT
GLOVE BIOGEL PI IND STRL 7.0 (GLOVE) ×1 IMPLANT
GLOVE BIOGEL PI INDICATOR 7.0 (GLOVE) ×1
GLOVE SURG ORTHO 8.0 STRL STRW (GLOVE) ×8 IMPLANT
GOWN STRL REUS W/ TWL LRG LVL3 (GOWN DISPOSABLE) ×2 IMPLANT
GOWN STRL REUS W/TWL LRG LVL3 (GOWN DISPOSABLE) ×2
GOWN STRL REUS W/TWL LRG LVL4 (GOWN DISPOSABLE) ×2 IMPLANT
KIT RM TURNOVER STRD PROC AR (KITS) ×2 IMPLANT
NEEDLE SPNL 22GX3.5 QUINCKE BK (NEEDLE) ×2 IMPLANT
NS IRRIG 500ML POUR BTL (IV SOLUTION) ×2 IMPLANT
PACK EXTREMITY ARMC (MISCELLANEOUS) ×2 IMPLANT
PADDING CAST 4IN STRL (MISCELLANEOUS) ×2
PADDING CAST BLEND 4X4 STRL (MISCELLANEOUS) ×2 IMPLANT
PASSER SUT SWANSON 36MM LOOP (INSTRUMENTS) ×4 IMPLANT
SLING ARM LRG DEEP (SOFTGOODS) IMPLANT
SPLINT CAST 1 STEP 4X30 (MISCELLANEOUS) ×2 IMPLANT
SPLINT CAST 1 STEP 5X30 WHT (MISCELLANEOUS) IMPLANT
SPONGE LAP 18X18 5 PK (GAUZE/BANDAGES/DRESSINGS) ×2 IMPLANT
STAPLER SKIN PROX 35W (STAPLE) ×2 IMPLANT
STOCKINETTE BIAS CUT 6 980064 (GAUZE/BANDAGES/DRESSINGS) ×2 IMPLANT
STOCKINETTE M/LG 89821 (MISCELLANEOUS) ×2 IMPLANT
SUT ETHILON 4 0 P 3 18 (SUTURE) IMPLANT
SUT ETHILON 4-0 (SUTURE)
SUT ETHILON 4-0 FS2 18XMFL BLK (SUTURE)
SUT FIBERWIRE #5 38 CONV BLUE (SUTURE) ×4
SUT VIC AB 2-0 SH 27 (SUTURE) ×1
SUT VIC AB 2-0 SH 27XBRD (SUTURE) ×1 IMPLANT
SUT VIC AB 3-0 SH 27 (SUTURE) ×2
SUT VIC AB 3-0 SH 27X BRD (SUTURE) ×2 IMPLANT
SUTURE ETHLN 4-0 FS2 18XMF BLK (SUTURE) IMPLANT
SUTURE FIBERWR #5 38 CONV BLUE (SUTURE) ×2 IMPLANT

## 2017-03-30 NOTE — Anesthesia Procedure Notes (Signed)
Procedure Name: Intubation Date/Time: 03/30/2017 1:21 PM Performed by: Johnna Acosta Pre-anesthesia Checklist: Patient identified, Emergency Drugs available, Suction available, Patient being monitored and Timeout performed Patient Re-evaluated:Patient Re-evaluated prior to induction Oxygen Delivery Method: Circle system utilized Preoxygenation: Pre-oxygenation with 100% oxygen Induction Type: IV induction and Rapid sequence Ventilation: Mask ventilation with difficulty, Two handed mask ventilation required and Oral airway inserted - appropriate to patient size Laryngoscope Size: McGraph and 4 Grade View: Grade II Tube type: Oral Tube size: 7.5 mm Number of attempts: 1 Airway Equipment and Method: Stylet and Video-laryngoscopy Placement Confirmation: ETT inserted through vocal cords under direct vision,  positive ETCO2 and breath sounds checked- equal and bilateral Secured at: 22 cm Tube secured with: Tape Dental Injury: Teeth and Oropharynx as per pre-operative assessment  Difficulty Due To: Difficulty was anticipated, Difficult Airway- due to large tongue, Difficult Airway- due to reduced neck mobility and Difficult Airway- due to anterior larynx Future Recommendations: Recommend- induction with short-acting agent, and alternative techniques readily available

## 2017-03-30 NOTE — Op Note (Signed)
03/30/2017  2:55 PM  PATIENT:  Brian Payne    PRE-OPERATIVE DIAGNOSIS:  distal biceps tendon rupture right arm  POST-OPERATIVE DIAGNOSIS:  Same  PROCEDURE:  DISTAL BICEPS TENDON REPAIR RIGHT  SURGEON:  Park Breed, MD   ASST: Carlynn Spry, PAC   ANESTHESIA:   General  PREOPERATIVE INDICATIONS:  Brian Payne is a  61 y.o. male with a diagnosis of distal biceps tendon rupture right who failed conservative measures and elected for surgical management.    The risks benefits and alternatives were discussed with the patient preoperatively including but not limited to the risks of infection, bleeding, nerve injury, cardiopulmonary complications, the need for revision surgery, among others, and the patient was willing to proceed.  OPERATIVE IMPLANTS: None  OPERATIVE FINDINGS: Complete rupture of distal biceps tendon  EBL: None  TOURNIQUET TIME: 60  MIN  COMPLICATIONS:   None  OPERATIVE PROCEDURE: The patient was brought to the operating room and underwent satisfactory general anesthesia. The operative arm was prepped and draped in sterile fashion. Esmarch was applied and tourniquet inflated to 250 mmHg. An S-shaped incision was made crossing the elbow flexion crease. Dissection was carried out bluntly through subcutaneous tissues. Vessels were cauterized. Dissection proximally and medially revealed the retracted stump of the biceps tendon. This was freed up from adhesions and a #5 FiberWire suture was woven through the tendon multiple times. Kelly clamp was then directed through the interval between the radius and ulna until it was palpable posteriorly. A second incision was then made posterolaterally and dissection carried out down to the radius. Retractors were inserted to expose the bicipital tuberosity. Soft tissues were debrided away from here.  Drill holes were then made volarly and dorsally. The FiberWire suture with attached tendon was then pulled through the interval  between the radius and ulna. The FiberWire sutures were then passed through the bone tunnels and with the elbow flexed the tendon was tied to the bicipital tuberosity snugly over a good bone bridge. This re-created excellent tension on the biceps. The wounds were both irrigated. Subcutaneous tissue and posterior fascia were closed with 2-0 Vicryl. Skin was closed with staples. 1/2% Marcaine was placed in all wounds. Dry sterile dressing and long-arm posterior splint were applied with the arm flexed to 90. This was taped snuggly and tourniquet deflated with good return of blood flow to the hand. A sling was applied. Patient was awakened and taken to recovery in good condition.  Earnestine Leys, MD

## 2017-03-30 NOTE — Anesthesia Post-op Follow-up Note (Signed)
Anesthesia QCDR form completed.        

## 2017-03-30 NOTE — Progress Notes (Signed)
Ice pack to right arm.

## 2017-03-30 NOTE — Progress Notes (Signed)
Capillary refill  Positive to right hand   Can wiggle fingers on right

## 2017-03-30 NOTE — Transfer of Care (Signed)
Immediate Anesthesia Transfer of Care Note  Patient: Brian Payne  Procedure(s) Performed: Procedure(s): DISTAL BICEPS TENDON REPAIR (Right)  Patient Location: PACU  Anesthesia Type:General  Level of Consciousness: sedated  Airway & Oxygen Therapy: Patient Spontanous Breathing and Patient connected to face mask oxygen  Post-op Assessment: Report given to RN and Post -op Vital signs reviewed and stable  Post vital signs: Reviewed and stable  Last Vitals:  Vitals:   03/30/17 1200 03/30/17 1510  BP: (!) 145/83 128/73  Pulse: 71 87  Resp: 16 14  Temp: (!) 36.4 C (!) 36.3 C  SpO2: 99% 98%    Last Pain:  Vitals:   03/30/17 1510  TempSrc: Temporal  PainSc:          Complications: No apparent anesthesia complications

## 2017-03-30 NOTE — Anesthesia Preprocedure Evaluation (Addendum)
Anesthesia Evaluation  Patient identified by MRN, date of birth, ID band Patient awake    Reviewed: Allergy & Precautions, H&P , NPO status , Patient's Chart, lab work & pertinent test results  History of Anesthesia Complications Negative for: history of anesthetic complications  Airway Mallampati: III  TM Distance: <3 FB Neck ROM: limited    Dental  (+) Poor Dentition, Chipped   Pulmonary neg shortness of breath, sleep apnea ,           Cardiovascular Exercise Tolerance: Good hypertension, (-) angina+ CAD, + Cardiac Stents and + Peripheral Vascular Disease  (-) Past MI and (-) DOE      Neuro/Psych  Neuromuscular disease negative psych ROS   GI/Hepatic Neg liver ROS, hiatal hernia, GERD  Medicated and Controlled,  Endo/Other  negative endocrine ROS  Renal/GU      Musculoskeletal  (+) Arthritis ,   Abdominal   Peds  Hematology negative hematology ROS (+)   Anesthesia Other Findings Past Medical History: No date: Arthritis No date: Chronic neck pain No date: Coronary artery disease No date: Gout No date: H/O heart artery stent No date: History of hiatal hernia No date: Hypertension No date: Hypertension No date: Nerve pain     Comment:  idiopathic peripheral neuropathy No date: Sleep apnea  Past Surgical History: 01/2016: ANTERIOR FUSION CERVICAL SPINE 09/2013: BILATERAL CARPAL TUNNEL RELEASE 08/14/2010: COLONOSCOPY     Comment:  TA - rec 3 yr follow up Roxy Manns) [id number]: CORONARY ANGIOPLASTY WITH STENT PLACEMENT     Comment:  [facility] No date: HYDROCELE EXCISION No date: VASECTOMY  BMI    Body Mass Index:  34.58 kg/m      Reproductive/Obstetrics negative OB ROS                           Anesthesia Physical Anesthesia Plan  ASA: III  Anesthesia Plan: General ETT   Post-op Pain Management:    Induction: Intravenous  PONV Risk Score and Plan:   Airway Management Planned:  Oral ETT and Video Laryngoscope Planned  Additional Equipment:   Intra-op Plan:   Post-operative Plan: Extubation in OR  Informed Consent: I have reviewed the patients History and Physical, chart, labs and discussed the procedure including the risks, benefits and alternatives for the proposed anesthesia with the patient or authorized representative who has indicated his/her understanding and acceptance.   Dental Advisory Given  Plan Discussed with: Anesthesiologist, CRNA and Surgeon  Anesthesia Plan Comments: (Patient endorses no chest pain or SOB with exertion.  His METs are greater than 4.   He reports that he does not have syncopal episodes with strenuous exercise, that this was from a pinched nerve in his neck and resolved with his neck surgery.  Per 2014 ACC/AHA guidelines plan to proceed without any further cardiac testing. He does report some chest pain around his clavicles with neck extension that he has been told is 2/2 to his cervical spine and not cardiac in nature.  Patient consented for risks of anesthesia including but not limited to:  - adverse reactions to medications - damage to teeth, lips or other oral mucosa - sore throat or hoarseness - Damage to heart, brain, lungs or loss of life  Patient voiced understanding.)      Anesthesia Quick Evaluation

## 2017-03-30 NOTE — H&P (Signed)
THE PATIENT WAS SEEN PRIOR TO SURGERY TODAY.  HISTORY, ALLERGIES, HOME MEDICATIONS AND OPERATIVE PROCEDURE WERE REVIEWED. RISKS AND BENEFITS OF SURGERY DISCUSSED WITH PATIENT AGAIN.  NO CHANGES FROM INITIAL HISTORY AND PHYSICAL NOTED.    

## 2017-03-31 ENCOUNTER — Encounter: Payer: Self-pay | Admitting: Specialist

## 2017-03-31 NOTE — Anesthesia Postprocedure Evaluation (Signed)
Anesthesia Post Note  Patient: Brian Payne  Procedure(s) Performed: Procedure(s) (LRB): DISTAL BICEPS TENDON REPAIR (Right)  Patient location during evaluation: PACU Anesthesia Type: General Level of consciousness: awake and alert and oriented Pain management: pain level controlled Vital Signs Assessment: post-procedure vital signs reviewed and stable Respiratory status: spontaneous breathing Cardiovascular status: blood pressure returned to baseline Anesthetic complications: no     Last Vitals:  Vitals:   03/30/17 1617 03/30/17 1648  BP: (!) 142/88 (!) 147/83  Pulse: 78 80  Resp: 16   Temp: (!) 35.6 C   SpO2: 97% 97%    Last Pain:  Vitals:   03/30/17 1648  TempSrc:   PainSc: 3                  Boen Sterbenz

## 2017-04-02 ENCOUNTER — Other Ambulatory Visit: Payer: Self-pay | Admitting: Internal Medicine

## 2017-04-02 DIAGNOSIS — J40 Bronchitis, not specified as acute or chronic: Secondary | ICD-10-CM

## 2017-05-22 ENCOUNTER — Ambulatory Visit (INDEPENDENT_AMBULATORY_CARE_PROVIDER_SITE_OTHER): Payer: BLUE CROSS/BLUE SHIELD | Admitting: Internal Medicine

## 2017-05-22 ENCOUNTER — Encounter: Payer: Self-pay | Admitting: Internal Medicine

## 2017-05-22 VITALS — BP 126/72 | HR 83 | Ht 72.0 in | Wt 252.0 lb

## 2017-05-22 DIAGNOSIS — I10 Essential (primary) hypertension: Secondary | ICD-10-CM | POA: Diagnosis not present

## 2017-05-22 DIAGNOSIS — E785 Hyperlipidemia, unspecified: Secondary | ICD-10-CM

## 2017-05-22 DIAGNOSIS — G609 Hereditary and idiopathic neuropathy, unspecified: Secondary | ICD-10-CM

## 2017-05-22 DIAGNOSIS — M1A0790 Idiopathic chronic gout, unspecified ankle and foot, without tophus (tophi): Secondary | ICD-10-CM | POA: Diagnosis not present

## 2017-05-22 DIAGNOSIS — M5090 Cervical disc disorder, unspecified, unspecified cervical region: Secondary | ICD-10-CM | POA: Diagnosis not present

## 2017-05-22 DIAGNOSIS — Z23 Encounter for immunization: Secondary | ICD-10-CM | POA: Diagnosis not present

## 2017-05-22 DIAGNOSIS — Z125 Encounter for screening for malignant neoplasm of prostate: Secondary | ICD-10-CM

## 2017-05-22 DIAGNOSIS — Z0000 Encounter for general adult medical examination without abnormal findings: Secondary | ICD-10-CM

## 2017-05-22 DIAGNOSIS — I2510 Atherosclerotic heart disease of native coronary artery without angina pectoris: Secondary | ICD-10-CM | POA: Diagnosis not present

## 2017-05-22 DIAGNOSIS — Z0001 Encounter for general adult medical examination with abnormal findings: Secondary | ICD-10-CM

## 2017-05-22 LAB — POCT URINALYSIS DIPSTICK
BILIRUBIN UA: NEGATIVE
Blood, UA: NEGATIVE
GLUCOSE UA: NEGATIVE
KETONES UA: NEGATIVE
LEUKOCYTES UA: NEGATIVE
Nitrite, UA: NEGATIVE
PH UA: 6 (ref 5.0–8.0)
Protein, UA: NEGATIVE
Spec Grav, UA: 1.01 (ref 1.010–1.025)
Urobilinogen, UA: 0.2 E.U./dL

## 2017-05-22 MED ORDER — NITROGLYCERIN 0.4 MG SL SUBL: 0.4000 mg | SUBLINGUAL_TABLET | SUBLINGUAL | 3 refills | Status: DC | PRN

## 2017-05-22 NOTE — Progress Notes (Signed)
Date:  05/22/2017   Name:  Brian Payne   DOB:  1955/08/22   MRN:  [id number]   Chief Complaint: Annual Exam (FLU SHOT- ) and Depression (8- PHQ9) Brian Payne is a 61 y.o. male who presents today for his Complete Annual Exam. He feels fairly well but recovering from elbow surgery. He reports exercising none. He reports he is sleeping poorly due to neck and elbow pain.   Depression         This is a new problem.  The problem occurs daily.  Associated symptoms include no fatigue, no appetite change, no myalgias, no headaches and no suicidal ideas.     Exacerbated by: current health issues. Hypertension  This is a chronic problem. The problem is controlled. Associated symptoms include chest pain (that may be anginal or radiating from neck - expired NTG no benefit) and neck pain. Pertinent negatives include no headaches, palpitations or shortness of breath. Past treatments include beta blockers and ACE inhibitors. The current treatment provides significant improvement.  Hyperlipidemia  This is a chronic problem. The problem is controlled. Associated symptoms include chest pain (that may be anginal or radiating from neck - expired NTG no benefit). Pertinent negatives include no myalgias or shortness of breath. Current antihyperlipidemic treatment includes statins.  Neck Pain   This is a chronic problem. The problem occurs daily. The problem has been gradually worsening. The pain is present in the midline. The quality of the pain is described as aching and burning. The pain is worse during the night. Associated symptoms include chest pain (that may be anginal or radiating from neck - expired NTG no benefit). Pertinent negatives include no headaches or trouble swallowing. Treatments tried: hx anterior cervical fusion - pain worse since being out for elbow then returning to work.  Gout - no recent episodes on daily allopurinol.    Review of Systems  Constitutional: Negative for appetite  change, chills, diaphoresis, fatigue and unexpected weight change.  HENT: Negative for hearing loss, tinnitus, trouble swallowing and voice change.   Eyes: Negative for visual disturbance.  Respiratory: Negative for choking, shortness of breath and wheezing.   Cardiovascular: Positive for chest pain (that may be anginal or radiating from neck - expired NTG no benefit). Negative for palpitations and leg swelling.  Gastrointestinal: Negative for abdominal pain, blood in stool, constipation and diarrhea.  Genitourinary: Negative for difficulty urinating, dysuria and frequency.  Musculoskeletal: Positive for arthralgias, joint swelling (right elbow surgery), neck pain and neck stiffness. Negative for back pain and myalgias.  Skin: Negative for color change and rash.  Neurological: Negative for dizziness, syncope and headaches.  Hematological: Negative for adenopathy.  Psychiatric/Behavioral: Positive for depression, dysphoric mood and sleep disturbance (due to neck pain). Negative for self-injury and suicidal ideas.    Patient Active Problem List   Diagnosis Date Noted  . Status post cervical spinal fusion 05/20/2016  . Nail abnormality 05/20/2016  . Mild carotid artery disease ([place]) 02/25/2015  . Tinnitus of left ear 02/12/2015  . Cervical disc disease 11/20/2014  . Tubular adenoma of colon 11/20/2014  . 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 Dyslipidemia 11/20/2014  . ED (erectile dysfunction) of organic origin 11/20/2014  . Essential (primary) hypertension 11/20/2014  . Idiopathic peripheral neuropathy 11/20/2014  . Obstructive apnea 10/08/2011  . Arteriosclerosis of coronary artery 10/08/2011    Prior to Admission medications   Medication Sig Start Date End Date Taking? Authorizing Provider  allopurinol (ZYLOPRIM)  300 MG tablet Take 600 mg by mouth daily.    Yes [provider]  ALPRAZolam Duanne Moron) 0.5 MG tablet Take 0.5 mg by mouth every 6 (six) hours.  04/03/17  Yes [provider]  aspirin EC 81 MG tablet Take 81 mg by mouth daily.   Yes [provider]  b complex vitamins tablet Take 1 tablet by mouth daily.   Yes [provider]  gabapentin (NEURONTIN) 100 MG capsule Take 100 mg by mouth 2 (two) times daily. Take 100 mg in the morning & 100+300 mg capsule at bedtime 03/18/17  Yes [provider]  GINKGO BILOBA PO Take 1 tablet by mouth daily.   Yes [provider]  GLUCOSAMINE HCL PO Take 1,500 mg by mouth daily.  09/26/10  Yes [provider]  HYDROcodone-acetaminophen (NORCO) 7.5-325 MG tablet Take 1 tablet by mouth 2 (two) times daily as needed (for neck pain.).    Yes [provider]  losartan (COZAAR) 100 MG tablet TAKE 1 TABLET (100 MG TOTAL) BY MOUTH DAILY. 01/15/17  Yes Glean Hess, MD  meloxicam (MOBIC) 7.5 MG tablet Take 7.5-15 mg by mouth daily.    Yes [provider]  metoprolol succinate (TOPROL-XL) 25 MG 24 hr tablet TAKE 1 TABLET BY MOUTH EVERY DAY 11/19/16  Yes Glean Hess, MD  Multiple Vitamin (MULTIVITAMIN WITH MINERALS) TABS tablet Take 1 tablet by mouth daily.   Yes [provider]  Omega-3 Fatty Acids (FISH OIL) 1200 MG CAPS Take 1,200 mg by mouth daily.    Yes [provider]  pravastatin (PRAVACHOL) 20 MG tablet Take 20 mg by mouth daily.    Yes [provider]    Allergies  Allergen Reactions  . Statins Other (See Comments)    Muscle aches  . Levofloxacin     dizziness  . Codeine Itching and Rash    Past Surgical History:  Procedure Laterality Date  . ANTERIOR FUSION CERVICAL SPINE  01/2016  . BILATERAL CARPAL TUNNEL RELEASE  09/2013  . COLONOSCOPY  08/14/2010   TA - rec 3 yr follow up Roxy Manns)  . CORONARY ANGIOPLASTY WITH STENT PLACEMENT  [id number]   [place]  . DISTAL BICEPS TENDON REPAIR Right 03/30/2017   Procedure: DISTAL BICEPS TENDON REPAIR;  Surgeon: Earnestine Leys, MD;  Location: [facility] ORS;  Service:  Orthopedics;  Laterality: Right;  . HYDROCELE EXCISION    . VASECTOMY      Social History  Substance Use Topics  . Smoking status: Never Smoker  . Smokeless tobacco: Never Used  . Alcohol use No     Medication list has been reviewed and updated.  PHQ 2/9 Scores 05/22/2017 05/20/2016  PHQ - 2 Score 2 2  PHQ- 9 Score 8 4    Physical Exam  Constitutional: He is oriented to person, place, and time. He appears well-developed and well-nourished.  HENT:  Head: Normocephalic.  Right Ear: Tympanic membrane, external ear and ear canal normal.  Left Ear: Tympanic membrane, external ear and ear canal normal.  Nose: Nose normal.  Mouth/Throat: Uvula is midline and oropharynx is clear and moist.  Eyes: Pupils are equal, round, and reactive to light. Conjunctivae and EOM are normal.  Neck: Neck supple. Spinous process tenderness and muscular tenderness present. Carotid bruit is not present. No thyromegaly present.  Cardiovascular: Normal rate, regular rhythm, normal heart sounds and intact distal pulses.   Pulmonary/Chest: Effort normal and breath sounds normal. He has no wheezes. Right breast exhibits no  mass. Left breast exhibits no mass.  Abdominal: Soft. Normal appearance and bowel sounds are normal. There is no hepatosplenomegaly. There is no tenderness.  Musculoskeletal: Normal range of motion.       Cervical back: He exhibits bony tenderness. He exhibits no spasm.       Arms: Lymphadenopathy:    He has no cervical adenopathy.  Neurological: He is alert and oriented to person, place, and time. He has normal reflexes.  Skin: Skin is warm, dry and intact.  Psychiatric: He has a normal mood and affect. His speech is normal and behavior is normal. Judgment and thought content normal.  Nursing note and vitals reviewed.   BP 126/72   Pulse 83   Ht 6' (1.829 m)   Wt 252 lb (114.3 kg)   SpO2 99%   BMI 34.18 kg/m   Assessment and Plan: 1. Annual physical exam Normal exam except  for weight - POCT urinalysis dipstick  2. Prostate cancer screening DRE deferred - PSA  3. Essential (primary) hypertension controlled - CBC with Differential/Platelet - Comprehensive metabolic panel  4. Arteriosclerosis of coronary artery New NTG prescibed with cautions See Cardiology in the near future - nitroGLYCERIN (NITROSTAT) 0.4 MG SL tablet; Place 1 tablet (0.4 mg total) under the tongue every 5 (five) minutes as needed for chest pain.  Dispense: 50 tablet; Refill: 3 - Lipid panel  5. Idiopathic peripheral neuropathy stable  6. Idiopathic chronic gout of foot without tophus, unspecified laterality controlled - Uric acid  7. Dyslipidemia On statin therapy - Lipid panel  8. Need for influenza vaccination - Flu Vaccine QUAD 36+ mos IM  9. Cervical disc disease Possibly causing chest pain Follow up with pain management Continue tizanidine, mobic, gabapentin and hydrocodone   Meds ordered this encounter  Medications  . nitroGLYCERIN (NITROSTAT) 0.4 MG SL tablet    Sig: Place 1 tablet (0.4 mg total) under the tongue every 5 (five) minutes as needed for chest pain.    Dispense:  50 tablet    Refill:  3    Partially dictated using Editor, commissioning. Any errors are unintentional.  Halina Maidens, MD [facility]  05/22/2017

## 2017-05-23 LAB — COMPREHENSIVE METABOLIC PANEL
A/G RATIO: 2.5 — AB (ref 1.2–2.2)
ALBUMIN: 4.7 g/dL (ref 3.6–4.8)
ALT: 24 IU/L (ref 0–44)
AST: 25 IU/L (ref 0–40)
Alkaline Phosphatase: 63 IU/L (ref 39–117)
BUN / CREAT RATIO: 17 (ref 10–24)
BUN: 24 mg/dL (ref 8–27)
Bilirubin Total: 0.6 mg/dL (ref 0.0–1.2)
CALCIUM: 9.3 mg/dL (ref 8.6–10.2)
CO2: 21 mmol/L (ref 20–29)
Chloride: 105 mmol/L (ref 96–106)
Creatinine, Ser: 1.42 mg/dL — ABNORMAL HIGH (ref 0.76–1.27)
GFR, EST AFRICAN AMERICAN: 61 mL/min/{1.73_m2} (ref >59)
GFR, EST NON AFRICAN AMERICAN: 53 mL/min/{1.73_m2} — AB (ref >59)
GLOBULIN, TOTAL: 1.9 g/dL (ref 1.5–4.5)
Glucose: 82 mg/dL (ref 65–99)
POTASSIUM: 4.6 mmol/L (ref 3.5–5.2)
SODIUM: 144 mmol/L (ref 134–144)
TOTAL PROTEIN: 6.6 g/dL (ref 6.0–8.5)

## 2017-05-23 LAB — CBC WITH DIFFERENTIAL/PLATELET
BASOS: 0 %
Basophils Absolute: 0 10*3/uL (ref 0.0–0.2)
EOS (ABSOLUTE): 0.2 10*3/uL (ref 0.0–0.4)
EOS: 2 %
HEMATOCRIT: 38.5 % (ref 37.5–51.0)
HEMOGLOBIN: 12.6 g/dL — AB (ref 13.0–17.7)
IMMATURE GRANS (ABS): 0 10*3/uL (ref 0.0–0.1)
IMMATURE GRANULOCYTES: 0 %
LYMPHS: 22 %
Lymphocytes Absolute: 1.7 10*3/uL (ref 0.7–3.1)
MCH: 30.9 pg (ref 26.6–33.0)
MCHC: 32.7 g/dL (ref 31.5–35.7)
MCV: 94 fL (ref 79–97)
Monocytes Absolute: 0.6 10*3/uL (ref 0.1–0.9)
Monocytes: 7 %
NEUTROS ABS: 5.2 10*3/uL (ref 1.4–7.0)
NEUTROS PCT: 69 %
Platelets: 145 10*3/uL — ABNORMAL LOW (ref 150–379)
RBC: 4.08 x10E6/uL — ABNORMAL LOW (ref 4.14–5.80)
RDW: 14.2 % (ref 12.3–15.4)
WBC: 7.7 10*3/uL (ref 3.4–10.8)

## 2017-05-23 LAB — LIPID PANEL
CHOL/HDL RATIO: 5.1 ratio — AB (ref 0.0–5.0)
CHOLESTEROL TOTAL: 142 mg/dL (ref 100–199)
HDL: 28 mg/dL — ABNORMAL LOW (ref >39)
LDL Calculated: 60 mg/dL (ref 0–99)
Triglycerides: 269 mg/dL — ABNORMAL HIGH (ref 0–149)
VLDL CHOLESTEROL CAL: 54 mg/dL — AB (ref 5–40)

## 2017-05-23 LAB — URIC ACID: URIC ACID: 3.7 mg/dL (ref 3.7–8.6)

## 2017-05-23 LAB — PSA: PROSTATE SPECIFIC AG, SERUM: 1.6 ng/mL (ref 0.0–4.0)

## 2017-05-25 ENCOUNTER — Other Ambulatory Visit: Payer: Self-pay | Admitting: Internal Medicine

## 2017-05-25 ENCOUNTER — Encounter: Payer: Self-pay | Admitting: Internal Medicine

## 2017-05-25 DIAGNOSIS — N183 Chronic kidney disease, stage 3 unspecified: Secondary | ICD-10-CM | POA: Insufficient documentation

## 2017-05-25 MED ORDER — FEBUXOSTAT 40 MG PO TABS: 40.0000 mg | ORAL_TABLET | Freq: Every day | ORAL | 5 refills | Status: DC

## 2017-06-05 HISTORY — PX: CORONARY ANGIOPLASTY WITH STENT PLACEMENT: SHX49

## 2017-06-19 ENCOUNTER — Encounter: Payer: Self-pay | Admitting: Internal Medicine

## 2017-06-19 ENCOUNTER — Ambulatory Visit: Payer: BLUE CROSS/BLUE SHIELD | Admitting: Internal Medicine

## 2017-06-30 ENCOUNTER — Other Ambulatory Visit: Payer: Self-pay | Admitting: Neurology

## 2017-06-30 DIAGNOSIS — M542 Cervicalgia: Secondary | ICD-10-CM

## 2017-07-03 ENCOUNTER — Ambulatory Visit
Admission: RE | Admit: 2017-07-03 | Discharge: 2017-07-03 | Disposition: A | Discharge status: Home / Self Care | Payer: BLUE CROSS/BLUE SHIELD | Source: Ambulatory Visit | Attending: Neurology | Admitting: Neurology

## 2017-07-03 DIAGNOSIS — M542 Cervicalgia: Secondary | ICD-10-CM

## 2017-07-03 DIAGNOSIS — G8929 Other chronic pain: Secondary | ICD-10-CM | POA: Diagnosis present

## 2017-07-03 DIAGNOSIS — M4802 Spinal stenosis, cervical region: Secondary | ICD-10-CM | POA: Diagnosis not present

## 2017-07-03 DIAGNOSIS — M4804 Spinal stenosis, thoracic region: Secondary | ICD-10-CM | POA: Diagnosis not present

## 2017-07-03 DIAGNOSIS — M5134 Other intervertebral disc degeneration, thoracic region: Secondary | ICD-10-CM | POA: Insufficient documentation

## 2017-07-16 ENCOUNTER — Other Ambulatory Visit: Payer: Self-pay | Admitting: Physical Medicine & Rehabilitation

## 2017-07-16 DIAGNOSIS — M5412 Radiculopathy, cervical region: Secondary | ICD-10-CM

## 2017-07-31 ENCOUNTER — Ambulatory Visit: Admission: RE | Admit: 2017-07-31 | Payer: BLUE CROSS/BLUE SHIELD | Source: Ambulatory Visit

## 2017-07-31 ENCOUNTER — Ambulatory Visit: Payer: BLUE CROSS/BLUE SHIELD

## 2017-08-10 DIAGNOSIS — H9313 Tinnitus, bilateral: Secondary | ICD-10-CM | POA: Diagnosis not present

## 2017-08-10 DIAGNOSIS — R200 Anesthesia of skin: Secondary | ICD-10-CM | POA: Diagnosis not present

## 2017-08-10 DIAGNOSIS — M542 Cervicalgia: Secondary | ICD-10-CM | POA: Diagnosis not present

## 2017-08-11 DIAGNOSIS — M5412 Radiculopathy, cervical region: Secondary | ICD-10-CM | POA: Diagnosis not present

## 2017-08-25 DIAGNOSIS — Z981 Arthrodesis status: Secondary | ICD-10-CM | POA: Diagnosis not present

## 2017-08-28 ENCOUNTER — Other Ambulatory Visit: Payer: Self-pay

## 2017-08-28 DIAGNOSIS — J40 Bronchitis, not specified as acute or chronic: Secondary | ICD-10-CM

## 2017-08-28 MED ORDER — FLUTICASONE PROPIONATE 50 MCG/ACT NA SUSP: 2.0000 | Freq: Every day | NASAL | 6 refills | Status: DC

## 2017-09-16 DIAGNOSIS — I10 Essential (primary) hypertension: Secondary | ICD-10-CM | POA: Diagnosis not present

## 2017-09-16 DIAGNOSIS — I2510 Atherosclerotic heart disease of native coronary artery without angina pectoris: Secondary | ICD-10-CM | POA: Diagnosis not present

## 2017-09-16 DIAGNOSIS — E782 Mixed hyperlipidemia: Secondary | ICD-10-CM | POA: Diagnosis not present

## 2017-09-17 DIAGNOSIS — M5416 Radiculopathy, lumbar region: Secondary | ICD-10-CM | POA: Diagnosis not present

## 2017-09-21 ENCOUNTER — Other Ambulatory Visit: Payer: Self-pay | Admitting: Internal Medicine

## 2017-09-21 MED ORDER — FEBUXOSTAT 40 MG PO TABS: 40.0000 mg | ORAL_TABLET | Freq: Every day | ORAL | 1 refills | Status: DC

## 2017-09-30 DIAGNOSIS — L97511 Non-pressure chronic ulcer of other part of right foot limited to breakdown of skin: Secondary | ICD-10-CM | POA: Diagnosis not present

## 2017-09-30 DIAGNOSIS — G609 Hereditary and idiopathic neuropathy, unspecified: Secondary | ICD-10-CM | POA: Diagnosis not present

## 2017-10-21 DIAGNOSIS — L97511 Non-pressure chronic ulcer of other part of right foot limited to breakdown of skin: Secondary | ICD-10-CM | POA: Diagnosis not present

## 2017-11-19 ENCOUNTER — Other Ambulatory Visit: Payer: Self-pay | Admitting: Internal Medicine

## 2017-12-14 DIAGNOSIS — M1712 Unilateral primary osteoarthritis, left knee: Secondary | ICD-10-CM | POA: Diagnosis not present

## 2017-12-17 DIAGNOSIS — M792 Neuralgia and neuritis, unspecified: Secondary | ICD-10-CM | POA: Diagnosis not present

## 2017-12-17 DIAGNOSIS — R252 Cramp and spasm: Secondary | ICD-10-CM | POA: Diagnosis not present

## 2017-12-17 DIAGNOSIS — M5412 Radiculopathy, cervical region: Secondary | ICD-10-CM | POA: Diagnosis not present

## 2017-12-17 DIAGNOSIS — Z79891 Long term (current) use of opiate analgesic: Secondary | ICD-10-CM | POA: Diagnosis not present

## 2017-12-18 ENCOUNTER — Other Ambulatory Visit: Payer: Self-pay | Admitting: Internal Medicine

## 2017-12-26 ENCOUNTER — Other Ambulatory Visit: Payer: Self-pay | Admitting: Internal Medicine

## 2017-12-26 DIAGNOSIS — I2510 Atherosclerotic heart disease of native coronary artery without angina pectoris: Secondary | ICD-10-CM

## 2018-01-14 DIAGNOSIS — M1A00X0 Idiopathic chronic gout, unspecified site, without tophus (tophi): Secondary | ICD-10-CM | POA: Diagnosis not present

## 2018-01-14 DIAGNOSIS — Z79899 Other long term (current) drug therapy: Secondary | ICD-10-CM | POA: Diagnosis not present

## 2018-03-18 DIAGNOSIS — M5412 Radiculopathy, cervical region: Secondary | ICD-10-CM | POA: Diagnosis not present

## 2018-04-01 DIAGNOSIS — M1712 Unilateral primary osteoarthritis, left knee: Secondary | ICD-10-CM | POA: Diagnosis not present

## 2018-05-15 ENCOUNTER — Other Ambulatory Visit: Payer: Self-pay | Admitting: Internal Medicine

## 2018-05-25 ENCOUNTER — Encounter: Payer: Self-pay | Admitting: Internal Medicine

## 2018-05-25 ENCOUNTER — Ambulatory Visit (INDEPENDENT_AMBULATORY_CARE_PROVIDER_SITE_OTHER): Payer: 59 | Admitting: Internal Medicine

## 2018-05-25 VITALS — BP 136/80 | HR 67 | Ht 72.0 in | Wt 257.0 lb

## 2018-05-25 DIAGNOSIS — M1712 Unilateral primary osteoarthritis, left knee: Secondary | ICD-10-CM

## 2018-05-25 DIAGNOSIS — Z23 Encounter for immunization: Secondary | ICD-10-CM | POA: Diagnosis not present

## 2018-05-25 DIAGNOSIS — G4733 Obstructive sleep apnea (adult) (pediatric): Secondary | ICD-10-CM

## 2018-05-25 DIAGNOSIS — E785 Hyperlipidemia, unspecified: Secondary | ICD-10-CM | POA: Diagnosis not present

## 2018-05-25 DIAGNOSIS — I25119 Atherosclerotic heart disease of native coronary artery with unspecified angina pectoris: Secondary | ICD-10-CM

## 2018-05-25 DIAGNOSIS — Z125 Encounter for screening for malignant neoplasm of prostate: Secondary | ICD-10-CM

## 2018-05-25 DIAGNOSIS — M1A0790 Idiopathic chronic gout, unspecified ankle and foot, without tophus (tophi): Secondary | ICD-10-CM

## 2018-05-25 DIAGNOSIS — N183 Chronic kidney disease, stage 3 unspecified: Secondary | ICD-10-CM

## 2018-05-25 DIAGNOSIS — I10 Essential (primary) hypertension: Secondary | ICD-10-CM | POA: Diagnosis not present

## 2018-05-25 DIAGNOSIS — Z0000 Encounter for general adult medical examination without abnormal findings: Secondary | ICD-10-CM

## 2018-05-25 DIAGNOSIS — I739 Peripheral vascular disease, unspecified: Secondary | ICD-10-CM

## 2018-05-25 DIAGNOSIS — I779 Disorder of arteries and arterioles, unspecified: Secondary | ICD-10-CM

## 2018-05-25 LAB — POCT URINALYSIS DIPSTICK
Bilirubin, UA: NEGATIVE
Glucose, UA: NEGATIVE
KETONES UA: NEGATIVE
LEUKOCYTES UA: NEGATIVE
Nitrite, UA: NEGATIVE
PH UA: 6 (ref 5.0–8.0)
PROTEIN UA: NEGATIVE
RBC UA: NEGATIVE
SPEC GRAV UA: 1.01 (ref 1.010–1.025)
UROBILINOGEN UA: 0.2 U/dL

## 2018-05-25 MED ORDER — PRAVASTATIN SODIUM 20 MG PO TABS: 20.0000 mg | ORAL_TABLET | Freq: Every day | ORAL | 3 refills | Status: DC

## 2018-05-25 NOTE — Progress Notes (Signed)
Date:  05/25/2018   Name:  Brian Payne   DOB:  [birth year]   MRN:  [id number]   Chief Complaint: Annual Exam Brian Payne is a 62 y.o. male who presents today for his Complete Annual Exam. He feels fairly well. He reports exercising noe due to severe knee OA. He reports he is sleeping fairly well. Colonoscopy done in 2012 was normal.   CAD - had T PTCA and stents placed last November.  On Plavix and aspirin, pravachol.  He has NTG but has not had to use it.  Has upcoming visit with Cardiology. Hypertension  This is a chronic problem. The problem is controlled. Pertinent negatives include no chest pain, headaches, palpitations or shortness of breath. Past treatments include angiotensin blockers and beta blockers.  Hyperlipidemia  This is a chronic problem. Pertinent negatives include no chest pain, myalgias or shortness of breath. Current antihyperlipidemic treatment includes statins (mild muscle cramps with pravachol but tolerating it).  Knee Pain   There was no injury mechanism. The pain is present in the left knee. The quality of the pain is described as aching. The pain is moderate. The symptoms are aggravated by weight bearing.  Gout - supposed to be on Uloric due to renal insufficiency but he declined and is taking allopurinol instead.  Recent GFR was 44.  No recent flares up. OSA - on CPAP nightly and doing well.  Review of Systems  Constitutional: Negative for appetite change, chills, diaphoresis, fatigue and unexpected weight change.  HENT: Positive for tinnitus (chronic). Negative for hearing loss, trouble swallowing and voice change.   Eyes: Negative for visual disturbance.  Respiratory: Negative for choking, shortness of breath and wheezing.   Cardiovascular: Negative for chest pain, palpitations and leg swelling.  Gastrointestinal: Negative for abdominal pain, blood in stool, constipation and diarrhea.  Genitourinary: Negative for difficulty urinating, dysuria,  frequency and hematuria.  Musculoskeletal: Positive for arthralgias. Negative for back pain and myalgias.  Skin: Negative for color change and rash.  Neurological: Negative for dizziness, syncope and headaches.  Hematological: Negative for adenopathy.  Psychiatric/Behavioral: Negative for dysphoric mood and sleep disturbance.    Patient Active Problem List   Diagnosis Date Noted  . CKD (chronic kidney disease), stage III ([place]) 05/25/2017  . Status post cervical spinal fusion 05/20/2016  . Nail abnormality 05/20/2016  . Mild carotid artery disease ([place]) 02/25/2015  . Tinnitus of left ear 02/12/2015  . Cervical disc disease 11/20/2014  . Tubular adenoma of colon 11/20/2014  . 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 Dyslipidemia 11/20/2014  . ED (erectile dysfunction) of organic origin 11/20/2014  . Essential (primary) hypertension 11/20/2014  . Idiopathic peripheral neuropathy 11/20/2014  . Obstructive apnea 10/08/2011  . Coronary artery disease involving native coronary artery of native heart with angina pectoris ([place]) 10/08/2011    Allergies  Allergen Reactions  . Statins Other (See Comments)    Muscle aches  . Levofloxacin     dizziness  . Codeine Itching and Rash    Past Surgical History:  Procedure Laterality Date  . ANTERIOR FUSION CERVICAL SPINE  01/2016  . BILATERAL CARPAL TUNNEL RELEASE  09/2013  . COLONOSCOPY  08/14/2010   TA - rec 3 yr follow up Brian Payne)  . CORONARY ANGIOPLASTY WITH STENT PLACEMENT  [id number]   [place]  . CORONARY ANGIOPLASTY WITH STENT PLACEMENT  06/05/2017   PCI of LCx with placement of 4.0 x 61mm DES  . DISTAL BICEPS TENDON REPAIR  Right 03/30/2017   Procedure: DISTAL BICEPS TENDON REPAIR;  Surgeon: Brian Leys, MD;  Location: [facility] ORS;  Service: Orthopedics;  Laterality: Right;  . HYDROCELE EXCISION    . VASECTOMY      Social History   Tobacco Use  . Smoking status: Never Smoker  . Smokeless tobacco: Never Used    Substance Use Topics  . Alcohol use: No    Alcohol/week: 0.0 standard drinks  . Drug use: No     Medication list has been reviewed and updated.  Current Meds  Medication Sig  . allopurinol (ZYLOPRIM) 100 MG tablet Take 200 mg by mouth daily.  Marland Kitchen aspirin EC 81 MG tablet Take 81 mg by mouth daily.  Marland Kitchen b complex vitamins tablet Take 1 tablet by mouth daily.  . clopidogrel (PLAVIX) 75 MG tablet Take 75 mg by mouth daily.  Marland Kitchen gabapentin (NEURONTIN) 100 MG capsule Take 100 mg by mouth 2 (two) times daily. Take 100 mg in the morning & 100+300 mg capsule at bedtime  . GINKGO BILOBA PO Take 1 tablet by mouth daily.  Marland Kitchen GLUCOSAMINE HCL PO Take 1,500 mg by mouth daily.   Marland Kitchen HYDROcodone-acetaminophen (NORCO) 7.5-325 MG tablet Take 1 tablet by mouth 2 (two) times daily as needed (for neck pain.).   Marland Kitchen losartan (COZAAR) 100 MG tablet TAKE 1 TABLET (100 MG TOTAL) BY MOUTH DAILY.  . meloxicam (MOBIC) 7.5 MG tablet Take 7.5-15 mg by mouth daily.   . metoprolol succinate (TOPROL-XL) 25 MG 24 hr tablet TAKE 1 TABLET BY MOUTH EVERY DAY  . Multiple Vitamin (MULTIVITAMIN WITH MINERALS) TABS tablet Take 1 tablet by mouth daily.  . nitroGLYCERIN (NITROSTAT) 0.4 MG SL tablet PLACE 1 TABLET (0.4 MG TOTAL) UNDER THE TONGUE EVERY 5 (FIVE) MINUTES AS NEEDED FOR CHEST PAIN.  Marland Kitchen Omega-3 Fatty Acids (FISH OIL) 1200 MG CAPS Take 1,200 mg by mouth daily.   . pravastatin (PRAVACHOL) 20 MG tablet Take 20 mg by mouth daily.     PHQ 2/9 Scores 05/25/2018 05/22/2017 05/20/2016  PHQ - 2 Score 0 2 2  PHQ- 9 Score - 8 4    Physical Exam  Constitutional: He is oriented to person, place, and time. He appears well-developed and well-nourished.  HENT:  Head: Normocephalic.  Right Ear: Tympanic membrane, external ear and ear canal normal.  Left Ear: Tympanic membrane, external ear and ear canal normal.  Nose: Nose normal.  Mouth/Throat: Uvula is midline and oropharynx is clear and moist.  Eyes: Pupils are equal, round, and  reactive to light. Conjunctivae and EOM are normal.  Neck: Normal range of motion. Neck supple. Carotid bruit is not present. No thyromegaly present.  Cardiovascular: Normal rate, regular rhythm, normal heart sounds and intact distal pulses.  Pulmonary/Chest: Effort normal and breath sounds normal. He has no wheezes. Right breast exhibits no mass. Left breast exhibits no mass.  Abdominal: Soft. Normal appearance and bowel sounds are normal. There is no hepatosplenomegaly. There is no tenderness.  Musculoskeletal: Normal range of motion.  Lymphadenopathy:    He has no cervical adenopathy.  Neurological: He is alert and oriented to person, place, and time. He has normal reflexes.  Skin: Skin is warm, dry and intact.  Psychiatric: He has a normal mood and affect. His speech is normal and behavior is normal. Judgment and thought content normal.  Nursing note and vitals reviewed.   BP 136/80 (BP Location: Left Arm, Patient Position: Sitting, Cuff Size: Large)   Pulse 67   Ht 6' (  1.829 m)   Wt 257 lb (116.6 kg)   SpO2 100%   BMI 34.86 kg/m   Assessment and Plan: 1. Annual physical exam Normal exam except for weight Unable to exercise - recommend limiting calories and carbs - POCT urinalysis dipstick  2. Prostate cancer screening DRE deferred to lack of sx - PSA  3. Essential (primary) hypertension controlled - CBC with Differential/Platelet  4. Coronary artery disease involving native coronary artery of native heart with angina pectoris ([place]) Stable with no recent sx but unable to exercise   5. Mild carotid artery disease ([facility]) Continue statin therapy  6. Dyslipidemia Tolerating pravachol - pravastatin (PRAVACHOL) 20 MG tablet; Take 1 tablet (20 mg total) by mouth daily.  Dispense: 90 tablet; Refill: 3 - Lipid panel  7. CKD (chronic kidney disease), stage III ([place]) Pt cautioned against nsaids and allopurinol which his Rheum continues to prescribe - Comprehensive metabolic  panel  8. Idiopathic chronic gout of foot without tophus, unspecified laterality - Uric acid  9. Obstructive apnea Doing well on CPAP  10. Primary osteoarthritis of left knee Follow up with Ortho Handicapped parking application given  Partially dictated using Editor, commissioning. Any errors are unintentional.  Halina Maidens, MD [facility]  05/25/2018

## 2018-05-26 LAB — CBC WITH DIFFERENTIAL/PLATELET
BASOS ABS: 0 10*3/uL (ref 0.0–0.2)
Basos: 1 %
EOS (ABSOLUTE): 0.2 10*3/uL (ref 0.0–0.4)
EOS: 4 %
HEMATOCRIT: 37.4 % — AB (ref 37.5–51.0)
Hemoglobin: 13.1 g/dL (ref 13.0–17.7)
IMMATURE GRANULOCYTES: 0 %
Immature Grans (Abs): 0 10*3/uL (ref 0.0–0.1)
LYMPHS ABS: 1.5 10*3/uL (ref 0.7–3.1)
Lymphs: 24 %
MCH: 33.1 pg — ABNORMAL HIGH (ref 26.6–33.0)
MCHC: 35 g/dL (ref 31.5–35.7)
MCV: 94 fL (ref 79–97)
MONOS ABS: 0.5 10*3/uL (ref 0.1–0.9)
Monocytes: 9 %
NEUTROS PCT: 62 %
Neutrophils Absolute: 3.9 10*3/uL (ref 1.4–7.0)
Platelets: 136 10*3/uL — ABNORMAL LOW (ref 150–450)
RBC: 3.96 x10E6/uL — AB (ref 4.14–5.80)
RDW: 13.4 % (ref 12.3–15.4)
WBC: 6.2 10*3/uL (ref 3.4–10.8)

## 2018-05-26 LAB — COMPREHENSIVE METABOLIC PANEL
ALT: 28 IU/L (ref 0–44)
AST: 27 IU/L (ref 0–40)
Albumin/Globulin Ratio: 2.3 — ABNORMAL HIGH (ref 1.2–2.2)
Albumin: 4.8 g/dL (ref 3.6–4.8)
Alkaline Phosphatase: 64 IU/L (ref 39–117)
BUN/Creatinine Ratio: 22 (ref 10–24)
BUN: 32 mg/dL — ABNORMAL HIGH (ref 8–27)
Bilirubin Total: 0.6 mg/dL (ref 0.0–1.2)
CALCIUM: 9.9 mg/dL (ref 8.6–10.2)
CO2: 23 mmol/L (ref 20–29)
CREATININE: 1.47 mg/dL — AB (ref 0.76–1.27)
Chloride: 106 mmol/L (ref 96–106)
GFR calc Af Amer: 58 mL/min/{1.73_m2} — ABNORMAL LOW (ref >59)
GFR, EST NON AFRICAN AMERICAN: 50 mL/min/{1.73_m2} — AB (ref >59)
GLUCOSE: 107 mg/dL — AB (ref 65–99)
Globulin, Total: 2.1 g/dL (ref 1.5–4.5)
Potassium: 5.3 mmol/L — ABNORMAL HIGH (ref 3.5–5.2)
Sodium: 144 mmol/L (ref 134–144)
Total Protein: 6.9 g/dL (ref 6.0–8.5)

## 2018-05-26 LAB — LIPID PANEL
CHOL/HDL RATIO: 5 ratio (ref 0.0–5.0)
Cholesterol, Total: 151 mg/dL (ref 100–199)
HDL: 30 mg/dL — AB (ref >39)
LDL CALC: 66 mg/dL (ref 0–99)
TRIGLYCERIDES: 274 mg/dL — AB (ref 0–149)
VLDL Cholesterol Cal: 55 mg/dL — ABNORMAL HIGH (ref 5–40)

## 2018-05-26 LAB — URIC ACID: Uric Acid: 4.8 mg/dL (ref 3.7–8.6)

## 2018-05-26 LAB — PSA: Prostate Specific Ag, Serum: 1.7 ng/mL (ref 0.0–4.0)

## 2018-06-17 ENCOUNTER — Other Ambulatory Visit: Payer: Self-pay

## 2018-06-17 MED ORDER — LOSARTAN POTASSIUM 100 MG PO TABS: ORAL_TABLET | ORAL | 5 refills | Status: DC

## 2018-06-24 DIAGNOSIS — R252 Cramp and spasm: Secondary | ICD-10-CM | POA: Diagnosis not present

## 2018-06-24 DIAGNOSIS — M792 Neuralgia and neuritis, unspecified: Secondary | ICD-10-CM | POA: Diagnosis not present

## 2018-06-24 DIAGNOSIS — M5412 Radiculopathy, cervical region: Secondary | ICD-10-CM | POA: Diagnosis not present

## 2018-08-02 DIAGNOSIS — G4733 Obstructive sleep apnea (adult) (pediatric): Secondary | ICD-10-CM | POA: Diagnosis not present

## 2018-08-06 ENCOUNTER — Encounter: Payer: Self-pay | Admitting: Internal Medicine

## 2018-08-06 ENCOUNTER — Ambulatory Visit (INDEPENDENT_AMBULATORY_CARE_PROVIDER_SITE_OTHER): Payer: 59 | Admitting: Internal Medicine

## 2018-08-06 VITALS — BP 121/80 | HR 73 | Resp 16 | Ht 72.0 in | Wt 262.0 lb

## 2018-08-06 DIAGNOSIS — I25119 Atherosclerotic heart disease of native coronary artery with unspecified angina pectoris: Secondary | ICD-10-CM | POA: Diagnosis not present

## 2018-08-06 DIAGNOSIS — I10 Essential (primary) hypertension: Secondary | ICD-10-CM | POA: Diagnosis not present

## 2018-08-06 DIAGNOSIS — G4733 Obstructive sleep apnea (adult) (pediatric): Secondary | ICD-10-CM | POA: Diagnosis not present

## 2018-08-06 DIAGNOSIS — I739 Peripheral vascular disease, unspecified: Secondary | ICD-10-CM

## 2018-08-06 DIAGNOSIS — I779 Disorder of arteries and arterioles, unspecified: Secondary | ICD-10-CM

## 2018-08-06 NOTE — Progress Notes (Signed)
Date:  08/06/2018   Name:  Brian Payne   DOB:  07-Feb-1956   MRN:  [id number]   Chief Complaint: Sleep Apnea (Face to Face CPAP )  OSA - on CPAP.  Pt presents for face to face follow up so that supplies will be covered by insurance.  No witnessed apneas, no snoring since using CPAP.  Compliance is excellent - he uses it nightly for a minimum of 6 hours with 99% compliance. He denies morning headaches, daytime sleepiness, falling asleep during the day, decreased concentration or depression.  HTN - controlled, no recent chest pain or use of NTG.  He has routine follow ups with Cardiology.  He continues on statin and aspirin, as well as Plavix and Losartan.  Review of Systems  Constitutional: Negative for chills, fatigue, fever and unexpected weight change.  Respiratory: Negative for chest tightness, shortness of breath and wheezing.   Cardiovascular: Negative for chest pain, palpitations and leg swelling.  Neurological: Negative for dizziness and headaches.  Psychiatric/Behavioral: Negative for decreased concentration, dysphoric mood and sleep disturbance.    Patient Active Problem List   Diagnosis Date Noted  . CKD (chronic kidney disease), stage III ([employer]) 05/25/2017  . Status post cervical spinal fusion 05/20/2016  . Nail abnormality 05/20/2016  . Mild carotid artery disease ([place]) 02/25/2015  . Tinnitus of left ear 02/12/2015  . Cervical disc disease 11/20/2014  . Tubular adenoma of colon 11/20/2014  . 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 Dyslipidemia 11/20/2014  . ED (erectile dysfunction) of organic origin 11/20/2014  . Essential (primary) hypertension 11/20/2014  . Idiopathic peripheral neuropathy 11/20/2014  . Obstructive apnea 10/08/2011  . Coronary artery disease involving native coronary artery of native heart with angina pectoris ([place]) 10/08/2011    Allergies  Allergen Reactions  . Statins Other (See Comments)    Muscle aches  .  Levofloxacin     dizziness  . Codeine Itching and Rash    Past Surgical History:  Procedure Laterality Date  . ANTERIOR FUSION CERVICAL SPINE  01/2016  . BILATERAL CARPAL TUNNEL RELEASE  09/2013  . COLONOSCOPY  08/14/2010   TA - rec 3 yr follow up Roxy Manns)  . CORONARY ANGIOPLASTY WITH STENT PLACEMENT  [id number]   [place]  . CORONARY ANGIOPLASTY WITH STENT PLACEMENT  06/05/2017   PCI of LCx with placement of 4.0 x 32mm DES  . DISTAL BICEPS TENDON REPAIR Right 03/30/2017   Procedure: DISTAL BICEPS TENDON REPAIR;  Surgeon: Earnestine Leys, MD;  Location: [facility] ORS;  Service: Orthopedics;  Laterality: Right;  . HYDROCELE EXCISION    . VASECTOMY      Social History   Tobacco Use  . Smoking status: Never Smoker  . Smokeless tobacco: Never Used  Substance Use Topics  . Alcohol use: No    Alcohol/week: 0.0 standard drinks  . Drug use: No     Medication list has been reviewed and updated.  Current Meds  Medication Sig  . allopurinol (ZYLOPRIM) 100 MG tablet Take 200 mg by mouth daily.  Marland Kitchen aspirin EC 81 MG tablet Take 81 mg by mouth daily.  Marland Kitchen b complex vitamins tablet Take 1 tablet by mouth daily.  . clopidogrel (PLAVIX) 75 MG tablet Take 75 mg by mouth daily.  Marland Kitchen gabapentin (NEURONTIN) 100 MG capsule Take 100 mg by mouth 2 (two) times daily. Take 100 mg in the morning & 100+300 mg capsule at bedtime  . GINKGO BILOBA PO Take 1 tablet by  mouth daily.  Marland Kitchen GLUCOSAMINE HCL PO Take 1,500 mg by mouth daily.   Marland Kitchen HYDROcodone-acetaminophen (NORCO) 7.5-325 MG tablet Take 1 tablet by mouth 2 (two) times daily as needed (for neck pain.).   Marland Kitchen losartan (COZAAR) 100 MG tablet TAKE 1 TABLET (100 MG TOTAL) BY MOUTH DAILY.  . meloxicam (MOBIC) 7.5 MG tablet Take 7.5-15 mg by mouth daily.   . metoprolol succinate (TOPROL-XL) 25 MG 24 hr tablet TAKE 1 TABLET BY MOUTH EVERY DAY  . Multiple Vitamin (MULTIVITAMIN WITH MINERALS) TABS tablet Take 1 tablet by mouth daily.  . nitroGLYCERIN (NITROSTAT) 0.4 MG SL tablet  PLACE 1 TABLET (0.4 MG TOTAL) UNDER THE TONGUE EVERY 5 (FIVE) MINUTES AS NEEDED FOR CHEST PAIN.  Marland Kitchen Omega-3 Fatty Acids (FISH OIL) 1200 MG CAPS Take 1,200 mg by mouth daily.   . pravastatin (PRAVACHOL) 20 MG tablet Take 1 tablet (20 mg total) by mouth daily.    PHQ 2/9 Scores 08/06/2018 05/25/2018 05/22/2017 05/20/2016  PHQ - 2 Score 0 0 2 2  PHQ- 9 Score - - 8 4    Physical Exam Vitals signs and nursing note reviewed.  Constitutional:      General: He is not in acute distress.    Appearance: He is well-developed.  HENT:     Head: Normocephalic and atraumatic.     Mouth/Throat:     Mouth: Mucous membranes are moist.  Eyes:     Extraocular Movements: Extraocular movements intact.     Pupils: Pupils are equal, round, and reactive to light.  Neck:     Musculoskeletal: Normal range of motion and neck supple.  Cardiovascular:     Rate and Rhythm: Normal rate and regular rhythm.     Pulses: Normal pulses.  Pulmonary:     Effort: Pulmonary effort is normal. No respiratory distress.     Breath sounds: Normal breath sounds. No wheezing or rhonchi.  Musculoskeletal: Normal range of motion.  Skin:    General: Skin is warm and dry.     Findings: No rash.  Neurological:     Mental Status: He is alert and oriented to person, place, and time.  Psychiatric:        Behavior: Behavior normal.        Thought Content: Thought content normal.    Neck circumference: 19 inches  Wt Readings from Last 3 Encounters:  08/06/18 262 lb (118.8 kg)  05/25/18 257 lb (116.6 kg)  05/22/17 252 lb (114.3 kg)    BP 121/80   Pulse 73   Resp 16   Ht 6' (1.829 m)   Wt 262 lb (118.8 kg)   SpO2 98%   BMI 35.53 kg/m   Assessment and Plan: 1. Obstructive apnea Doing well by exam and history Continue same therapy -   2. Mild carotid artery disease ([place]) On statin therapy  3. Essential (primary) hypertension controlled  4. Coronary artery disease involving native coronary artery of native heart  with angina pectoris [facility]) Followed by Cardiology   Partially dictated using Dragon software. Any errors are unintentional.  Halina Maidens, MD [facility]  08/06/2018

## 2018-08-28 DIAGNOSIS — M1712 Unilateral primary osteoarthritis, left knee: Secondary | ICD-10-CM | POA: Diagnosis not present

## 2018-08-28 DIAGNOSIS — M25462 Effusion, left knee: Secondary | ICD-10-CM | POA: Diagnosis not present

## 2018-08-28 DIAGNOSIS — S8992XA Unspecified injury of left lower leg, initial encounter: Secondary | ICD-10-CM | POA: Diagnosis not present

## 2018-08-28 DIAGNOSIS — S81011A Laceration without foreign body, right knee, initial encounter: Secondary | ICD-10-CM | POA: Diagnosis not present

## 2018-08-28 DIAGNOSIS — S81012A Laceration without foreign body, left knee, initial encounter: Secondary | ICD-10-CM | POA: Diagnosis not present

## 2018-09-08 DIAGNOSIS — I10 Essential (primary) hypertension: Secondary | ICD-10-CM | POA: Diagnosis not present

## 2018-09-08 DIAGNOSIS — E781 Pure hyperglyceridemia: Secondary | ICD-10-CM | POA: Diagnosis not present

## 2018-09-08 DIAGNOSIS — I2510 Atherosclerotic heart disease of native coronary artery without angina pectoris: Secondary | ICD-10-CM | POA: Diagnosis not present

## 2018-09-14 ENCOUNTER — Other Ambulatory Visit: Payer: Self-pay | Admitting: Internal Medicine

## 2018-09-15 ENCOUNTER — Ambulatory Visit (INDEPENDENT_AMBULATORY_CARE_PROVIDER_SITE_OTHER): Payer: 59 | Admitting: Internal Medicine

## 2018-09-15 ENCOUNTER — Other Ambulatory Visit: Payer: Self-pay

## 2018-09-15 ENCOUNTER — Encounter: Payer: Self-pay | Admitting: Internal Medicine

## 2018-09-15 VITALS — BP 134/80 | HR 73 | Ht 72.0 in | Wt 263.0 lb

## 2018-09-15 DIAGNOSIS — K5732 Diverticulitis of large intestine without perforation or abscess without bleeding: Secondary | ICD-10-CM | POA: Diagnosis not present

## 2018-09-15 MED ORDER — AMOXICILLIN-POT CLAVULANATE 875-125 MG PO TABS: 1.0000 | ORAL_TABLET | Freq: Two times a day (BID) | ORAL | 0 refills | Status: AC

## 2018-09-15 NOTE — Progress Notes (Signed)
Date:  09/15/2018   Name:  Brian Payne   DOB:  06/05/1956   MRN:  [id number]   Chief Complaint: Abdominal Pain (Left sided. Pain started 3 weeks ago. Off and on diarrhea and then consitpation. More gas than anything.  )  Abdominal Cramping  This is a new problem. The current episode started 1 to 4 weeks ago. The onset quality is undetermined. The problem occurs daily. The problem has been unchanged. The pain is located in the LLQ. The pain is mild. The quality of the pain is cramping and colicky. The abdominal pain does not radiate. Associated symptoms include constipation, diarrhea and flatus. Pertinent negatives include no arthralgias, fever, headaches, vomiting or weight loss. Nothing aggravates the pain. The pain is relieved by nothing.    Review of Systems  Constitutional: Negative for chills, fatigue, fever and weight loss.  Respiratory: Negative for chest tightness, shortness of breath and wheezing.   Cardiovascular: Negative for chest pain, palpitations and leg swelling.  Gastrointestinal: Positive for abdominal pain, constipation, diarrhea and flatus. Negative for vomiting.  Musculoskeletal: Negative for arthralgias.  Neurological: Negative for dizziness and headaches.    Patient Active Problem List   Diagnosis Date Noted  . CKD (chronic kidney disease), stage III ([place]) 05/25/2017  . Status post cervical spinal fusion 05/20/2016  . Nail abnormality 05/20/2016  . Mild carotid artery disease ([place]) 02/25/2015  . Tinnitus of left ear 02/12/2015  . Cervical disc disease 11/20/2014  . Tubular adenoma of colon 11/20/2014  . 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 Dyslipidemia 11/20/2014  . ED (erectile dysfunction) of organic origin 11/20/2014  . Essential (primary) hypertension 11/20/2014  . Idiopathic peripheral neuropathy 11/20/2014  . Obstructive apnea 10/08/2011  . Coronary artery disease involving native coronary artery of native heart with  angina pectoris ([place]) 10/08/2011    Allergies  Allergen Reactions  . Statins Other (See Comments)    Muscle aches  . Levofloxacin     dizziness  . Codeine Itching and Rash    Past Surgical History:  Procedure Laterality Date  . ANTERIOR FUSION CERVICAL SPINE  01/2016  . BILATERAL CARPAL TUNNEL RELEASE  09/2013  . COLONOSCOPY  08/14/2010   TA - rec 3 yr follow up Roxy Manns)  . CORONARY ANGIOPLASTY WITH STENT PLACEMENT  [id number]   [place]  . CORONARY ANGIOPLASTY WITH STENT PLACEMENT  06/05/2017   PCI of LCx with placement of 4.0 x 31mm DES  . DISTAL BICEPS TENDON REPAIR Right 03/30/2017   Procedure: DISTAL BICEPS TENDON REPAIR;  Surgeon: Earnestine Leys, MD;  Location: [facility] ORS;  Service: Orthopedics;  Laterality: Right;  . HYDROCELE EXCISION    . VASECTOMY      Social History   Tobacco Use  . Smoking status: Never Smoker  . Smokeless tobacco: Never Used  Substance Use Topics  . Alcohol use: No    Alcohol/week: 0.0 standard drinks  . Drug use: No     Medication list has been reviewed and updated.  Current Meds  Medication Sig  . allopurinol (ZYLOPRIM) 100 MG tablet Take 200 mg by mouth daily.  Marland Kitchen aspirin EC 81 MG tablet Take 81 mg by mouth daily.  Marland Kitchen b complex vitamins tablet Take 1 tablet by mouth daily.  . clopidogrel (PLAVIX) 75 MG tablet Take 75 mg by mouth daily.  Marland Kitchen gabapentin (NEURONTIN) 100 MG capsule Take 100 mg by mouth 2 (two) times daily. Take 100 mg in the morning & 100+300 mg  capsule at bedtime  . GINKGO BILOBA PO Take 1 tablet by mouth daily.  Marland Kitchen GLUCOSAMINE HCL PO Take 1,500 mg by mouth daily.   Marland Kitchen HYDROcodone-acetaminophen (NORCO) 7.5-325 MG tablet Take 1 tablet by mouth 2 (two) times daily as needed (for neck pain.).   Marland Kitchen losartan (COZAAR) 100 MG tablet TAKE 1 TABLET (100 MG TOTAL) BY MOUTH DAILY.  . meloxicam (MOBIC) 7.5 MG tablet Take 7.5-15 mg by mouth daily.   . metoprolol succinate (TOPROL-XL) 25 MG 24 hr tablet TAKE 1 TABLET BY MOUTH EVERY DAY  . Multiple  Vitamin (MULTIVITAMIN WITH MINERALS) TABS tablet Take 1 tablet by mouth daily.  . nitroGLYCERIN (NITROSTAT) 0.4 MG SL tablet PLACE 1 TABLET (0.4 MG TOTAL) UNDER THE TONGUE EVERY 5 (FIVE) MINUTES AS NEEDED FOR CHEST PAIN.  . NON FORMULARY CPAP @@ 10 cm H2O  . Omega-3 Fatty Acids (FISH OIL) 1200 MG CAPS Take 1,200 mg by mouth daily.   . Oxymetazoline HCl-Menthol (AFRIN MENTHOL SPRAY NA) Place into the nose at bedtime.  . pravastatin (PRAVACHOL) 20 MG tablet Take 1 tablet (20 mg total) by mouth daily.    PHQ 2/9 Scores 08/06/2018 05/25/2018 05/22/2017 05/20/2016  PHQ - 2 Score 0 0 2 2  PHQ- 9 Score - - 8 4    Physical Exam Vitals signs and nursing note reviewed.  Constitutional:      General: He is not in acute distress.    Appearance: He is well-developed.  HENT:     Head: Normocephalic and atraumatic.  Cardiovascular:     Rate and Rhythm: Normal rate and regular rhythm.  Pulmonary:     Effort: Pulmonary effort is normal. No respiratory distress.     Breath sounds: Normal breath sounds.  Abdominal:     General: Bowel sounds are decreased. There is no distension.     Palpations: Abdomen is soft. There is no hepatomegaly or splenomegaly.     Tenderness: There is abdominal tenderness in the left lower quadrant. There is no guarding or rebound.     Hernia: No hernia is present.  Musculoskeletal: Normal range of motion.  Skin:    General: Skin is warm and dry.     Findings: No rash.  Neurological:     Mental Status: He is alert and oriented to person, place, and time.  Psychiatric:        Behavior: Behavior normal.        Thought Content: Thought content normal.     BP 134/80   Pulse 73   Ht 6' (1.829 m)   Wt 263 lb (119.3 kg)   SpO2 96%   BMI 35.67 kg/m   Assessment and Plan: 1. Diverticulitis of large intestine without perforation or abscess without bleeding Begin probiotics for 2-4 weeks - amoxicillin-clavulanate (AUGMENTIN) 875-125 MG tablet; Take 1 tablet by mouth 2  (two) times daily for 10 days.  Dispense: 20 tablet; Refill: 0   Partially dictated using Editor, commissioning. Any errors are unintentional.  Halina Maidens, MD [facility]  09/15/2018

## 2018-09-30 DIAGNOSIS — R252 Cramp and spasm: Secondary | ICD-10-CM | POA: Diagnosis not present

## 2018-09-30 DIAGNOSIS — M5412 Radiculopathy, cervical region: Secondary | ICD-10-CM | POA: Diagnosis not present

## 2018-09-30 DIAGNOSIS — M792 Neuralgia and neuritis, unspecified: Secondary | ICD-10-CM | POA: Diagnosis not present

## 2018-10-13 ENCOUNTER — Other Ambulatory Visit: Payer: Self-pay | Admitting: Internal Medicine

## 2018-11-01 DIAGNOSIS — G4733 Obstructive sleep apnea (adult) (pediatric): Secondary | ICD-10-CM | POA: Diagnosis not present

## 2018-11-16 ENCOUNTER — Telehealth: Payer: Self-pay | Admitting: Internal Medicine

## 2018-11-16 NOTE — Telephone Encounter (Signed)
Pt changed appt to video visit at 2

## 2018-11-16 NOTE — Telephone Encounter (Signed)
Dr Army Melia asked to change back to OV. Sai dhe needs labs and might as well come in. Changed Video to OV

## 2018-11-17 ENCOUNTER — Other Ambulatory Visit: Payer: Self-pay

## 2018-11-17 ENCOUNTER — Encounter: Payer: Self-pay | Admitting: Internal Medicine

## 2018-11-17 ENCOUNTER — Ambulatory Visit (INDEPENDENT_AMBULATORY_CARE_PROVIDER_SITE_OTHER): Payer: 59 | Admitting: Internal Medicine

## 2018-11-17 ENCOUNTER — Ambulatory Visit: Payer: 59 | Admitting: Internal Medicine

## 2018-11-17 VITALS — BP 136/76 | HR 66 | Ht 72.0 in | Wt 260.0 lb

## 2018-11-17 DIAGNOSIS — N183 Chronic kidney disease, stage 3 unspecified: Secondary | ICD-10-CM

## 2018-11-17 DIAGNOSIS — I10 Essential (primary) hypertension: Secondary | ICD-10-CM | POA: Diagnosis not present

## 2018-11-17 DIAGNOSIS — D126 Benign neoplasm of colon, unspecified: Secondary | ICD-10-CM

## 2018-11-17 DIAGNOSIS — M1A0790 Idiopathic chronic gout, unspecified ankle and foot, without tophus (tophi): Secondary | ICD-10-CM | POA: Diagnosis not present

## 2018-11-17 NOTE — Progress Notes (Signed)
Date:  11/17/2018   Name:  Brian Payne   DOB:  02-15-1956   MRN:  [id number]   Chief Complaint: Hypertension (recheck renal function.)  Hypertension  This is a chronic problem. The problem is controlled (average readings 145/90). Pertinent negatives include no chest pain, headaches, palpitations or shortness of breath. Past treatments include beta blockers and angiotensin blockers. The current treatment provides significant improvement.  CKD - being monitored due to  gradual worsening. Also taking allopurinol 450 mg per day from Rheumatology as well as Meloxicam which he only takes 1-2 times per month. Last GFR = 50 Colon Polyps - had TA in 2012 and probably had repeat in 2015 but he does not know the MD (done in [place]).  He likely needs repeat this year.  He does not want a referral at this time. Gout - followed by Rheumatology, now on allopurinol 450 mg per day.  He has not had any flares in the past 6 months or more.  Lab Results  Component Value Date   CREATININE 1.47 (H) 05/25/2018   BUN 32 (H) 05/25/2018   NA 144 05/25/2018   K 5.3 (H) 05/25/2018   CL 106 05/25/2018   CO2 23 05/25/2018    Review of Systems  Constitutional: Negative for chills, fatigue and fever.  Eyes: Negative for visual disturbance.  Respiratory: Negative for cough, chest tightness, shortness of breath and wheezing.   Cardiovascular: Positive for leg swelling. Negative for chest pain and palpitations.  Musculoskeletal: Positive for arthralgias and joint swelling.  Neurological: Negative for dizziness and headaches.  Psychiatric/Behavioral: Negative for sleep disturbance. The patient is not nervous/anxious.     Patient Active Problem List   Diagnosis Date Noted  . CKD (chronic kidney disease), stage III ([place]) 05/25/2017  . Status post cervical spinal fusion 05/20/2016  . Nail abnormality 05/20/2016  . Mild carotid artery disease ([place]) 02/25/2015  . Tinnitus of left ear 02/12/2015  . Cervical  disc disease 11/20/2014  . Tubular adenoma of colon 11/20/2014  . 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 Dyslipidemia 11/20/2014  . ED (erectile dysfunction) of organic origin 11/20/2014  . Essential (primary) hypertension 11/20/2014  . Idiopathic peripheral neuropathy 11/20/2014  . Obstructive apnea 10/08/2011  . Coronary artery disease involving native coronary artery of native heart with angina pectoris ([place]) 10/08/2011    Allergies  Allergen Reactions  . Statins Other (See Comments)    Muscle aches  . Levofloxacin     dizziness  . Codeine Itching and Rash    Past Surgical History:  Procedure Laterality Date  . ANTERIOR FUSION CERVICAL SPINE  01/2016  . BILATERAL CARPAL TUNNEL RELEASE  09/2013  . COLONOSCOPY  08/14/2010   TA - rec 3 yr follow up Roxy Manns)  . CORONARY ANGIOPLASTY WITH STENT PLACEMENT  [id number]   [place]  . CORONARY ANGIOPLASTY WITH STENT PLACEMENT  06/05/2017   PCI of LCx with placement of 4.0 x 61mm DES  . DISTAL BICEPS TENDON REPAIR Right 03/30/2017   Procedure: DISTAL BICEPS TENDON REPAIR;  Surgeon: Earnestine Leys, MD;  Location: [facility] ORS;  Service: Orthopedics;  Laterality: Right;  . HYDROCELE EXCISION    . VASECTOMY      Social History   Tobacco Use  . Smoking status: Never Smoker  . Smokeless tobacco: Never Used  Substance Use Topics  . Alcohol use: No    Alcohol/week: 0.0 standard drinks  . Drug use: No     Medication list  has been reviewed and updated.  Current Meds  Medication Sig  . allopurinol (ZYLOPRIM) 100 MG tablet Take 200 mg by mouth daily.  Marland Kitchen aspirin EC 81 MG tablet Take 81 mg by mouth daily.  Marland Kitchen b complex vitamins tablet Take 1 tablet by mouth daily.  . clopidogrel (PLAVIX) 75 MG tablet TAKE 1 TABLET BY MOUTH EVERY DAY  . gabapentin (NEURONTIN) 100 MG capsule Take 100 mg by mouth 2 (two) times daily. Take 100 mg in the morning & 100+300 mg capsule at bedtime  . GINKGO BILOBA PO Take 1 tablet by mouth daily.  Marland Kitchen  GLUCOSAMINE HCL PO Take 1,500 mg by mouth daily.   Marland Kitchen HYDROcodone-acetaminophen (NORCO) 7.5-325 MG tablet Take 1 tablet by mouth 2 (two) times daily as needed (for neck pain.).   Marland Kitchen losartan (COZAAR) 100 MG tablet TAKE 1 TABLET (100 MG TOTAL) BY MOUTH DAILY.  . meloxicam (MOBIC) 7.5 MG tablet Take 7.5-15 mg by mouth daily.   . metoprolol succinate (TOPROL-XL) 25 MG 24 hr tablet TAKE 1 TABLET BY MOUTH EVERY DAY  . Multiple Vitamin (MULTIVITAMIN WITH MINERALS) TABS tablet Take 1 tablet by mouth daily.  . nitroGLYCERIN (NITROSTAT) 0.4 MG SL tablet PLACE 1 TABLET (0.4 MG TOTAL) UNDER THE TONGUE EVERY 5 (FIVE) MINUTES AS NEEDED FOR CHEST PAIN.  . NON FORMULARY CPAP @@ 10 cm H2O  . Omega-3 Fatty Acids (FISH OIL) 1200 MG CAPS Take 1,200 mg by mouth daily.   . Oxymetazoline HCl-Menthol (AFRIN MENTHOL SPRAY NA) Place into the nose at bedtime.  . pravastatin (PRAVACHOL) 20 MG tablet Take 1 tablet (20 mg total) by mouth daily.    PHQ 2/9 Scores 11/17/2018 08/06/2018 05/25/2018 05/22/2017  PHQ - 2 Score 0 0 0 2  PHQ- 9 Score - - - 8    BP Readings from Last 3 Encounters:  11/17/18 136/76  09/15/18 134/80  08/06/18 121/80    Physical Exam Vitals signs and nursing note reviewed.  Constitutional:      General: He is not in acute distress.    Appearance: He is well-developed.  HENT:     Head: Normocephalic and atraumatic.  Neck:     Musculoskeletal: Normal range of motion and neck supple.  Cardiovascular:     Rate and Rhythm: Normal rate and regular rhythm.  Pulmonary:     Effort: Pulmonary effort is normal. No respiratory distress.     Breath sounds: No wheezing or rales.  Musculoskeletal: Normal range of motion.     Right lower leg: No edema.     Left lower leg: No edema.  Skin:    General: Skin is warm and dry.     Findings: No rash.  Neurological:     Mental Status: He is alert and oriented to person, place, and time.  Psychiatric:        Behavior: Behavior normal.        Thought  Content: Thought content normal.     Wt Readings from Last 3 Encounters:  11/17/18 260 lb (117.9 kg)  09/15/18 263 lb (119.3 kg)  08/06/18 262 lb (118.8 kg)    BP 136/76   Pulse 66   Ht 6' (1.829 m)   Wt 260 lb (117.9 kg)   SpO2 99%   BMI 35.26 kg/m   Assessment and Plan: 1. Essential (primary) hypertension Controlled, continue current medications  2. CKD (chronic kidney disease), stage III ([place]) Will need to continue to monitor every 6 months since he continues to take  high dose allopurinol - Renal Function Panel  3. Tubular adenoma of colon Probably due for 5 year colonoscopy  - pt declines referral for now  4. 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 Any errors are unintentional.  Halina Maidens, MD [facility]  11/17/2018

## 2018-11-17 NOTE — Patient Instructions (Signed)
This information is directly available on the CDC website: https://www.cdc.gov/coronavirus/2019-ncov/if-you-are-sick/steps-when-sick.html    Source:CDC Reference to specific commercial products, manufacturers, companies, or trademarks does not constitute its endorsement or recommendation by the U.S. Government, Department of Health and Human Services, or Centers for Disease Control and Prevention.  

## 2018-11-18 LAB — RENAL FUNCTION PANEL
Albumin: 4.5 g/dL (ref 3.8–4.8)
BUN/Creatinine Ratio: 16 (ref 10–24)
BUN: 22 mg/dL (ref 8–27)
CO2: 23 mmol/L (ref 20–29)
Calcium: 9.4 mg/dL (ref 8.6–10.2)
Chloride: 107 mmol/L — ABNORMAL HIGH (ref 96–106)
Creatinine, Ser: 1.38 mg/dL — ABNORMAL HIGH (ref 0.76–1.27)
GFR calc Af Amer: 62 mL/min/{1.73_m2} (ref >59)
GFR calc non Af Amer: 54 mL/min/{1.73_m2} — ABNORMAL LOW (ref >59)
Glucose: 122 mg/dL — ABNORMAL HIGH (ref 65–99)
Phosphorus: 2.8 mg/dL (ref 2.8–4.1)
Potassium: 4.5 mmol/L (ref 3.5–5.2)
Sodium: 145 mmol/L — ABNORMAL HIGH (ref 134–144)

## 2018-11-22 ENCOUNTER — Encounter: Payer: Self-pay | Admitting: Internal Medicine

## 2018-11-24 ENCOUNTER — Ambulatory Visit: Payer: 59 | Admitting: Internal Medicine

## 2018-12-17 DIAGNOSIS — G4733 Obstructive sleep apnea (adult) (pediatric): Secondary | ICD-10-CM | POA: Diagnosis not present

## 2019-01-10 ENCOUNTER — Ambulatory Visit
Admission: EM | Admit: 2019-01-10 | Discharge: 2019-01-10 | Disposition: A | Discharge status: Home / Self Care | Payer: 59 | Attending: Family Medicine | Admitting: Family Medicine

## 2019-01-10 ENCOUNTER — Encounter: Payer: Self-pay | Admitting: Emergency Medicine

## 2019-01-10 ENCOUNTER — Other Ambulatory Visit: Payer: Self-pay

## 2019-01-10 DIAGNOSIS — T17208A Unspecified foreign body in pharynx causing other injury, initial encounter: Secondary | ICD-10-CM

## 2019-01-10 NOTE — Discharge Instructions (Signed)
Follow up with ENT for further evaluation and management

## 2019-01-10 NOTE — ED Notes (Signed)
Patient scheduled an appointment at ENT in [facility].at 9:30am today

## 2019-01-10 NOTE — ED Provider Notes (Signed)
[facility]    CSN: [id number] Arrival date & time: 01/10/19  0804     History   Chief Complaint Chief Complaint  Patient presents with  . Foreign Body    HPI Brian Payne is a 63 y.o. male.   63 yo male with a c/o a piece of toothpick stuck in the back of his throat since last night. States he was using the toothpick and it broke. Now it feels like it's stuck and has discomfort when swallowing. Denies any trouble breathing.    Foreign Body    Past Medical History:  Diagnosis Date  . Arthritis   . Chronic neck pain   . Coronary artery disease   . Difficult intubation   . Gout   . H/O heart artery stent   . History of hiatal hernia   . Hypertension   . Hypertension   . Nerve pain    idiopathic peripheral neuropathy  . Sleep apnea   . Sleep apnea with use of continuous positive airway pressure (CPAP)     Patient Active Problem List   Diagnosis Date Noted  . CKD (chronic kidney disease), stage III ([place]) 05/25/2017  . Status post cervical spinal fusion 05/20/2016  . Nail abnormality 05/20/2016  . Mild carotid artery disease ([place]) 02/25/2015  . Tinnitus of left ear 02/12/2015  . Cervical disc disease 11/20/2014  . Tubular adenoma of colon 11/20/2014  . 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 Dyslipidemia 11/20/2014  . ED (erectile dysfunction) of organic origin 11/20/2014  . Essential (primary) hypertension 11/20/2014  . Idiopathic peripheral neuropathy 11/20/2014  . Obstructive apnea 10/08/2011  . Coronary artery disease involving native coronary artery of native heart with angina pectoris ([place]) 10/08/2011    Past Surgical History:  Procedure Laterality Date  . ANTERIOR FUSION CERVICAL SPINE  01/2016  . BILATERAL CARPAL TUNNEL RELEASE  09/2013  . COLONOSCOPY  08/14/2010   TA - rec 3 yr follow up Roxy Manns)  . CORONARY ANGIOPLASTY WITH STENT PLACEMENT  [id number]   [place]  . CORONARY ANGIOPLASTY WITH STENT PLACEMENT  06/05/2017    PCI of LCx with placement of 4.0 x 76mm DES  . DISTAL BICEPS TENDON REPAIR Right 03/30/2017   Procedure: DISTAL BICEPS TENDON REPAIR;  Surgeon: Earnestine Leys, MD;  Location: [facility] ORS;  Service: Orthopedics;  Laterality: Right;  . HYDROCELE EXCISION    . VASECTOMY         Home Medications    Prior to Admission medications   Medication Sig Start Date End Date Taking? Authorizing Provider  allopurinol (ZYLOPRIM) 100 MG tablet Take 450 mg by mouth daily.    Yes [provider]  aspirin EC 81 MG tablet Take 81 mg by mouth daily.   Yes [provider]  b complex vitamins tablet Take 1 tablet by mouth daily.   Yes [provider]  clopidogrel (PLAVIX) 75 MG tablet TAKE 1 TABLET BY MOUTH EVERY DAY 10/13/18  Yes Glean Hess, MD  gabapentin (NEURONTIN) 100 MG capsule Take 100 mg by mouth 2 (two) times daily. Take 100 mg in the morning & 100+300 mg capsule at bedtime 03/18/17  Yes [provider]  GINKGO BILOBA PO Take 1 tablet by mouth daily.   Yes [provider]  GLUCOSAMINE HCL PO Take 1,500 mg by mouth daily.  09/26/10  Yes [provider]  HYDROcodone-acetaminophen (NORCO) 7.5-325 MG tablet Take 1 tablet by mouth 2 (two) times daily as needed (  for neck pain.).    Yes [provider]  losartan (COZAAR) 100 MG tablet TAKE 1 TABLET (100 MG TOTAL) BY MOUTH DAILY. 06/17/18  Yes Glean Hess, MD  metoprolol succinate (TOPROL-XL) 25 MG 24 hr tablet TAKE 1 TABLET BY MOUTH EVERY DAY 05/15/18  Yes Glean Hess, MD  NON FORMULARY CPAP @@ 10 cm H2O   Yes [provider]  Omega-3 Fatty Acids (FISH OIL) 1200 MG CAPS Take 1,200 mg by mouth daily.    Yes [provider]  pravastatin (PRAVACHOL) 20 MG tablet Take 1 tablet (20 mg total) by mouth daily. 05/25/18  Yes Glean Hess, MD  meloxicam (MOBIC) 7.5 MG tablet Take 7.5-15 mg by mouth daily.     [provider]  Multiple Vitamin (MULTIVITAMIN WITH  MINERALS) TABS tablet Take 1 tablet by mouth daily.    [provider]  nitroGLYCERIN (NITROSTAT) 0.4 MG SL tablet PLACE 1 TABLET (0.4 MG TOTAL) UNDER THE TONGUE EVERY 5 (FIVE) MINUTES AS NEEDED FOR CHEST PAIN. 12/26/17   Glean Hess, MD  Oxymetazoline HCl-Menthol (AFRIN MENTHOL SPRAY NA) Place into the nose at bedtime.    [provider]    Family History Family History  Problem Relation Age of Onset  . Rheum arthritis Mother   . Rheumatic fever Father     Social History Social History   Tobacco Use  . Smoking status: Never Smoker  . Smokeless tobacco: Never Used  Substance Use Topics  . Alcohol use: No    Alcohol/week: 0.0 standard drinks  . Drug use: No     Allergies   Statins; Levofloxacin; and Codeine   Review of Systems Review of Systems   Physical Exam Triage Vital Signs ED Triage Vitals  Enc Vitals Group     BP 01/10/19 0815 (!) 148/84     Pulse Rate 01/10/19 0815 72     Resp 01/10/19 0815 16     Temp 01/10/19 0815 98.1 F (36.7 C)     Temp Source 01/10/19 0815 Oral     SpO2 01/10/19 0815 100 %     Weight 01/10/19 0812 255 lb (115.7 kg)     Height 01/10/19 0812 6' (1.829 m)     Head Circumference --      Peak Flow --      Pain Score 01/10/19 0812 3     Pain Loc --      Pain Edu? --      Excl. in [place]? --    No data found.  Updated Vital Signs BP (!) 148/84 (BP Location: Right Arm)   Pulse 72   Temp 98.1 F (36.7 C) (Oral)   Resp 16   Ht 6' (1.829 m)   Wt 115.7 kg   SpO2 100%   BMI 34.58 kg/m   Visual Acuity Right Eye Distance:   Left Eye Distance:   Bilateral Distance:    Right Eye Near:   Left Eye Near:    Bilateral Near:     Physical Exam Vitals signs and nursing note reviewed.  Constitutional:      General: He is not in acute distress.    Appearance: He is not toxic-appearing or diaphoretic.  HENT:     Mouth/Throat:     Mouth: Mucous membranes are moist.     Pharynx: Oropharynx is clear. No  oropharyngeal exudate or posterior oropharyngeal erythema.     Tonsils: No tonsillar exudate or tonsillar abscesses.  Neurological:  Mental Status: He is alert.      UC Treatments / Results  Labs (all labs ordered are listed, but only abnormal results are displayed) Labs Reviewed - No data to display  EKG None  Radiology No results found.  Procedures Procedures (including critical care time)  Medications Ordered in UC Medications - No data to display  Initial Impression / Assessment and Plan / UC Course  I have reviewed the triage vital signs and the nursing notes.  Pertinent labs & imaging results that were available during my care of the patient were reviewed by me and considered in my medical decision making (see chart for details).      Final Clinical Impressions(s) / UC Diagnoses   Final diagnoses:  Foreign body in pharynx, initial encounter     Discharge Instructions     Follow up with ENT for further evaluation and management    ED Prescriptions    None     1.diagnosis reviewed with patient 2.Unable to visualize foreign body in posterior pharynx; recommend further evaluation by ENT. Appointment made for patient to be seen by ENT. Patient will be seen this morning by [facility]. Patient in stable condition.   Controlled Substance Prescriptions Center Controlled Substance Registry consulted? Not Applicable   Norval Gable, MD 01/10/19 [phone]

## 2019-01-10 NOTE — ED Triage Notes (Signed)
Patient reports he thinks he has a piece of toothpick in his throat around 7pm last night.  Patient reports pain when swallowing.

## 2019-01-24 ENCOUNTER — Other Ambulatory Visit: Payer: Self-pay | Admitting: Internal Medicine

## 2019-02-17 ENCOUNTER — Other Ambulatory Visit: Payer: Self-pay | Admitting: Internal Medicine

## 2019-02-17 DIAGNOSIS — I2510 Atherosclerotic heart disease of native coronary artery without angina pectoris: Secondary | ICD-10-CM

## 2019-05-24 ENCOUNTER — Other Ambulatory Visit: Payer: Self-pay

## 2019-05-24 ENCOUNTER — Encounter: Payer: Self-pay | Admitting: Internal Medicine

## 2019-05-24 ENCOUNTER — Ambulatory Visit (INDEPENDENT_AMBULATORY_CARE_PROVIDER_SITE_OTHER): Payer: 59 | Admitting: Internal Medicine

## 2019-05-24 VITALS — BP 120/68 | HR 82 | Ht 72.0 in | Wt 256.0 lb

## 2019-05-24 DIAGNOSIS — Z981 Arthrodesis status: Secondary | ICD-10-CM | POA: Diagnosis not present

## 2019-05-24 DIAGNOSIS — R202 Paresthesia of skin: Secondary | ICD-10-CM

## 2019-05-24 DIAGNOSIS — R200 Anesthesia of skin: Secondary | ICD-10-CM

## 2019-05-24 DIAGNOSIS — M4802 Spinal stenosis, cervical region: Secondary | ICD-10-CM

## 2019-05-24 MED ORDER — METHOCARBAMOL 500 MG PO TABS: 500.0000 mg | ORAL_TABLET | Freq: Four times a day (QID) | ORAL | 0 refills | Status: DC

## 2019-05-24 NOTE — Progress Notes (Signed)
Date:  05/24/2019   Name:  Brian Payne   DOB:  [birth year]   MRN:  [id number]   Chief Complaint: Numbness (Numbness and tingling in hands. Worse in right than left. Works at a [employer] and after a few hours of working cannot feel his entire right arm and hand. Had a cervical fusion done a few years ago and this was supposed to relieve these symptoms but his symptoms have now gotten worse. )  Neck Pain  This is a chronic problem. The problem has been gradually worsening. The quality of the pain is described as burning. Associated symptoms include numbness. Pertinent negatives include no chest pain, fever, headaches or weakness.  Pt reports having numbness and tingling in his hands, R>L, that is worsening.  He had anterior cervical fusion in 2017 for this reason, 4 levels were fused.  He never had resolution of the symptoms and has continue home exercises, positioning, and pain clinic care with narcotics and muscle relaxants.  Over the past few months the symptoms are much worse and are related to sitting at his computer using a mouse all day.  He has tried ergonomic changes to his desk, using an arm rest, using a back brace that gives support and repositions his shoulder without any benefit.  He feels that he needs to take a leave from work to allow his current interventions to become more effective.  He has asked for 5 weeks and needs forms completed.   Review of Systems  Constitutional: Negative for chills, fatigue and fever.  Respiratory: Negative for chest tightness and shortness of breath.   Cardiovascular: Negative for chest pain, palpitations and leg swelling.  Gastrointestinal: Negative for abdominal pain.  Musculoskeletal: Positive for arthralgias, neck pain and neck stiffness.  Neurological: Positive for numbness. Negative for dizziness, weakness and headaches.    Patient Active Problem List   Diagnosis Date Noted  . CKD (chronic kidney disease), stage III 05/25/2017  .  Status post cervical spinal fusion 05/20/2016  . Nail abnormality 05/20/2016  . Mild carotid artery disease ([place]) 02/25/2015  . Tinnitus of left ear 02/12/2015  . Cervical disc disease 11/20/2014  . Tubular adenoma of colon 11/20/2014  . 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 Dyslipidemia 11/20/2014  . ED (erectile dysfunction) of organic origin 11/20/2014  . Essential (primary) hypertension 11/20/2014  . Idiopathic peripheral neuropathy 11/20/2014  . Obstructive apnea 10/08/2011  . Coronary artery disease involving native coronary artery of native heart with angina pectoris ([place]) 10/08/2011    Allergies  Allergen Reactions  . Statins Other (See Comments)    Muscle aches  . Levofloxacin     dizziness  . Codeine Itching and Rash    Past Surgical History:  Procedure Laterality Date  . ANTERIOR FUSION CERVICAL SPINE  01/2016  . BILATERAL CARPAL TUNNEL RELEASE  09/2013  . COLONOSCOPY  08/14/2010   TA - rec 3 yr follow up Roxy Manns)  . CORONARY ANGIOPLASTY WITH STENT PLACEMENT  [id number]   [place]  . CORONARY ANGIOPLASTY WITH STENT PLACEMENT  06/05/2017   PCI of LCx with placement of 4.0 x 63mm DES  . DISTAL BICEPS TENDON REPAIR Right 03/30/2017   Procedure: DISTAL BICEPS TENDON REPAIR;  Surgeon: Earnestine Leys, MD;  Location: [facility] ORS;  Service: Orthopedics;  Laterality: Right;  . HYDROCELE EXCISION    . VASECTOMY      Social History   Tobacco Use  . Smoking status: Never Smoker  .  Smokeless tobacco: Never Used  Substance Use Topics  . Alcohol use: No    Alcohol/week: 0.0 standard drinks  . Drug use: No     Medication list has been reviewed and updated.  Current Meds  Medication Sig  . allopurinol (ZYLOPRIM) 100 MG tablet Take 450 mg by mouth daily.   Marland Kitchen amitriptyline (ELAVIL) 10 MG tablet Take 10 mg by mouth at bedtime as needed.  . Aspirin Buf,CaCarb-MgCarb-MgO, 81 MG TABS Take 81 mg by mouth daily.  Marland Kitchen aspirin EC 81 MG tablet Take 81 mg by mouth  daily.  Marland Kitchen b complex vitamins tablet Take 1 tablet by mouth daily.  Marland Kitchen gabapentin (NEURONTIN) 100 MG capsule Take 100 mg by mouth 2 (two) times daily. Take 100 mg in the morning & 100+300 mg capsule at bedtime  . GINKGO BILOBA PO Take 1 tablet by mouth daily.  Marland Kitchen GLUCOSAMINE HCL PO Take 1,500 mg by mouth daily.   Marland Kitchen HYDROcodone-acetaminophen (NORCO) 7.5-325 MG tablet Take 1 tablet by mouth 2 (two) times daily as needed (for neck pain.).   Marland Kitchen losartan (COZAAR) 100 MG tablet TAKE 1 TABLET BY MOUTH EVERY DAY  . meloxicam (MOBIC) 7.5 MG tablet Take 7.5-15 mg by mouth daily as needed.   . metoprolol succinate (TOPROL-XL) 25 MG 24 hr tablet TAKE 1 TABLET BY MOUTH EVERY DAY  . Multiple Vitamin (MULTIVITAMIN WITH MINERALS) TABS tablet Take 1 tablet by mouth daily.  . nitroGLYCERIN (NITROSTAT) 0.4 MG SL tablet PLACE 1 TABLET UNDER THE TONGUE EVERY 5 MINUTES AS NEEDED FOR CHEST PAIN  . NON FORMULARY CPAP @@ 10 cm H2O  . Omega-3 Fatty Acids (FISH OIL) 1200 MG CAPS Take 1,200 mg by mouth daily.   . Oxymetazoline HCl-Menthol (AFRIN MENTHOL SPRAY NA) Place into the nose at bedtime.  . pravastatin (PRAVACHOL) 20 MG tablet Take 1 tablet (20 mg total) by mouth daily.    PHQ 2/9 Scores 05/24/2019 11/17/2018 08/06/2018 05/25/2018  PHQ - 2 Score 0 0 0 0  PHQ- 9 Score 3 - - -    BP Readings from Last 3 Encounters:  05/24/19 120/68  01/10/19 (!) 148/84  11/17/18 136/76    Physical Exam Vitals signs and nursing note reviewed.  Constitutional:      General: He is not in acute distress.    Appearance: Normal appearance. He is well-developed.  HENT:     Head: Normocephalic and atraumatic.  Neck:     Musculoskeletal: Muscular tenderness present.  Cardiovascular:     Rate and Rhythm: Normal rate and regular rhythm.  Pulmonary:     Effort: Pulmonary effort is normal. No respiratory distress.     Breath sounds: No wheezing or rhonchi.  Musculoskeletal:     Cervical back: He exhibits decreased range of motion,  tenderness and spasm.  Skin:    General: Skin is warm and dry.     Findings: No rash.  Neurological:     Mental Status: He is alert and oriented to person, place, and time.     Sensory: Sensory deficit (tingling in fingers of right hand) present.     Motor: Motor function is intact.     Coordination: Coordination is intact.     Deep Tendon Reflexes:     Reflex Scores:      Bicep reflexes are 1+ on the right side and 2+ on the left side. Psychiatric:        Attention and Perception: Attention normal.        Mood  and Affect: Mood normal.        Behavior: Behavior normal.        Thought Content: Thought content normal.     Wt Readings from Last 3 Encounters:  05/24/19 256 lb (116.1 kg)  01/10/19 255 lb (115.7 kg)  11/17/18 260 lb (117.9 kg)    BP 120/68   Pulse 82   Ht 6' (1.829 m)   Wt 256 lb (116.1 kg)   SpO2 97%   BMI 34.72 kg/m   Assessment and Plan: 1. Foraminal stenosis of cervical region Recommend trial of robaxin in place of tizanidine which is very sedating Continue all other current interventions Consider MRI -C-spine - last done 2 years ago and suggested progression of stenosis at several levels Forms will be completed for 5 week STD leave from work - methocarbamol (ROBAXIN) 500 MG tablet; Take 1 tablet (500 mg total) by mouth 4 (four) times daily.  Dispense: 120 tablet; Refill: 0  2. Status post cervical spinal fusion Currently under pain management  On hydrocodone, muscle relaxants Has tried ESI as well  3. Numbness and tingling in both hands Evaluated in the past for CTS - sx due to cervical disc disease but not relieved by Cervical fusion.   Partially dictated using Editor, commissioning. Any errors are unintentional.  Halina Maidens, MD [facility]  05/24/2019

## 2019-06-01 ENCOUNTER — Ambulatory Visit (INDEPENDENT_AMBULATORY_CARE_PROVIDER_SITE_OTHER): Payer: 59 | Admitting: Internal Medicine

## 2019-06-01 ENCOUNTER — Encounter: Payer: Self-pay | Admitting: Internal Medicine

## 2019-06-01 ENCOUNTER — Other Ambulatory Visit: Payer: Self-pay

## 2019-06-01 VITALS — BP 122/62 | HR 80 | Ht 72.0 in | Wt 258.0 lb

## 2019-06-01 DIAGNOSIS — M1A0790 Idiopathic chronic gout, unspecified ankle and foot, without tophus (tophi): Secondary | ICD-10-CM

## 2019-06-01 DIAGNOSIS — I10 Essential (primary) hypertension: Secondary | ICD-10-CM | POA: Diagnosis not present

## 2019-06-01 DIAGNOSIS — Z23 Encounter for immunization: Secondary | ICD-10-CM | POA: Diagnosis not present

## 2019-06-01 DIAGNOSIS — G4733 Obstructive sleep apnea (adult) (pediatric): Secondary | ICD-10-CM

## 2019-06-01 DIAGNOSIS — Z125 Encounter for screening for malignant neoplasm of prostate: Secondary | ICD-10-CM | POA: Diagnosis not present

## 2019-06-01 DIAGNOSIS — Z0000 Encounter for general adult medical examination without abnormal findings: Secondary | ICD-10-CM

## 2019-06-01 DIAGNOSIS — E785 Hyperlipidemia, unspecified: Secondary | ICD-10-CM

## 2019-06-01 LAB — POCT URINALYSIS DIPSTICK
Bilirubin, UA: NEGATIVE
Blood, UA: NEGATIVE
Glucose, UA: NEGATIVE
Ketones, UA: NEGATIVE
Leukocytes, UA: NEGATIVE
Nitrite, UA: NEGATIVE
Protein, UA: NEGATIVE
Spec Grav, UA: 1.01 (ref 1.010–1.025)
Urobilinogen, UA: 0.2 E.U./dL
pH, UA: 5 (ref 5.0–8.0)

## 2019-06-01 MED ORDER — PRAVASTATIN SODIUM 20 MG PO TABS: 20.0000 mg | ORAL_TABLET | Freq: Every day | ORAL | 3 refills | Status: DC

## 2019-06-01 NOTE — Progress Notes (Signed)
Date:  06/01/2019   Name:  Brian Payne   DOB:  01-03-1956   MRN:  [id number]   Chief Complaint: Annual Exam Brian Payne is a 63 y.o. male who presents today for his Complete Annual Exam. He feels fairly well. He reports exercising walking. He reports he is sleeping fairly well.   Colonoscopy  06/2011 Immunizations - flu vaccine due; Tdap done 2020  Hypertension This is a chronic problem. The problem is controlled. Associated symptoms include neck pain. Pertinent negatives include no chest pain, headaches, palpitations or shortness of breath. Past treatments include angiotensin blockers and beta blockers. The current treatment provides significant improvement. There are no compliance problems.  Hypertensive end-organ damage includes kidney disease and CAD/MI.  Hyperlipidemia This is a chronic problem. Pertinent negatives include no chest pain, myalgias or shortness of breath. Current antihyperlipidemic treatment includes statins. The current treatment provides significant improvement of lipids. There are no compliance problems.   CAD - followed by cardiology.  Also has mild CAS seen on a MRI of the neck- uncertain of follow up needed. Neck pain - recently seen and given LOA.  Changed muscle relaxants.  Pt is being treated for pain by pain management. OSA - using nightly, sleeping well.  Wakes feeling fairly rested.  No daytime naps, no morning headaches. Joint pain - he has stiff MCPs and wrists - R>L, no recent gout flares.  Allopurinol decreased to 450 mg per day.  Having to watch renal function closely on such a high dose.  Review of Systems  Constitutional: Negative for appetite change, chills, diaphoresis, fatigue and unexpected weight change.  HENT: Negative for hearing loss, tinnitus, trouble swallowing and voice change.   Eyes: Negative for visual disturbance.  Respiratory: Negative for choking, shortness of breath and wheezing.   Cardiovascular: Negative for chest pain,  palpitations and leg swelling.  Gastrointestinal: Negative for abdominal pain, blood in stool, constipation and diarrhea.  Genitourinary: Negative for difficulty urinating, dysuria and frequency.  Musculoskeletal: Positive for arthralgias and neck pain. Negative for back pain, gait problem and myalgias.  Skin: Negative for color change and rash.  Allergic/Immunologic: Negative for environmental allergies.  Neurological: Negative for dizziness, syncope and headaches.  Hematological: Negative for adenopathy.  Psychiatric/Behavioral: Negative for dysphoric mood and sleep disturbance.    Patient Active Problem List   Diagnosis Date Noted  . CKD (chronic kidney disease), stage III 05/25/2017  . Status post cervical spinal fusion 05/20/2016  . Nail abnormality 05/20/2016  . Mild carotid artery disease ([place]) 02/25/2015  . Tinnitus of left ear 02/12/2015  . Cervical disc disease 11/20/2014  . Tubular adenoma of colon 11/20/2014  . 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 Dyslipidemia 11/20/2014  . ED (erectile dysfunction) of organic origin 11/20/2014  . Essential (primary) hypertension 11/20/2014  . Idiopathic peripheral neuropathy 11/20/2014  . Obstructive apnea 10/08/2011  . Coronary artery disease involving native coronary artery of native heart with angina pectoris ([place]) 10/08/2011    Allergies  Allergen Reactions  . Statins Other (See Comments)    Muscle aches  . Levofloxacin     dizziness  . Codeine Itching and Rash    Past Surgical History:  Procedure Laterality Date  . ANTERIOR FUSION CERVICAL SPINE  01/2016  . BILATERAL CARPAL TUNNEL RELEASE  09/2013  . COLONOSCOPY  08/14/2010   TA - rec 3 yr follow up Roxy Manns)  . CORONARY ANGIOPLASTY WITH STENT PLACEMENT  [id number]   [place]  .  CORONARY ANGIOPLASTY WITH STENT PLACEMENT  06/05/2017   PCI of LCx with placement of 4.0 x 40mm DES  . DISTAL BICEPS TENDON REPAIR Right 03/30/2017   Procedure: DISTAL BICEPS  TENDON REPAIR;  Surgeon: Earnestine Leys, MD;  Location: [facility] ORS;  Service: Orthopedics;  Laterality: Right;  . HYDROCELE EXCISION    . VASECTOMY      Social History   Tobacco Use  . Smoking status: Never Smoker  . Smokeless tobacco: Never Used  Substance Use Topics  . Alcohol use: No    Alcohol/week: 0.0 standard drinks  . Drug use: No     Medication list has been reviewed and updated.  Current Meds  Medication Sig  . allopurinol (ZYLOPRIM) 100 MG tablet Take 450 mg by mouth daily.   Marland Kitchen amitriptyline (ELAVIL) 10 MG tablet Take 10 mg by mouth at bedtime as needed.  Marland Kitchen aspirin EC 81 MG tablet Take 81 mg by mouth daily.  Marland Kitchen b complex vitamins tablet Take 1 tablet by mouth daily.  Marland Kitchen gabapentin (NEURONTIN) 300 MG capsule   . GINKGO BILOBA PO Take 1 tablet by mouth daily.  Marland Kitchen GLUCOSAMINE HCL PO Take 1,500 mg by mouth daily.   Marland Kitchen HYDROcodone-acetaminophen (NORCO) 7.5-325 MG tablet Take 1 tablet by mouth 2 (two) times daily as needed (for neck pain.).   Marland Kitchen losartan (COZAAR) 100 MG tablet TAKE 1 TABLET BY MOUTH EVERY DAY  . meloxicam (MOBIC) 7.5 MG tablet Take 7.5-15 mg by mouth daily as needed.   . methocarbamol (ROBAXIN) 500 MG tablet Take 1 tablet (500 mg total) by mouth 4 (four) times daily.  . metoprolol succinate (TOPROL-XL) 25 MG 24 hr tablet TAKE 1 TABLET BY MOUTH EVERY DAY  . Multiple Vitamin (MULTIVITAMIN WITH MINERALS) TABS tablet Take 1 tablet by mouth daily.  . nitroGLYCERIN (NITROSTAT) 0.4 MG SL tablet PLACE 1 TABLET UNDER THE TONGUE EVERY 5 MINUTES AS NEEDED FOR CHEST PAIN  . NON FORMULARY CPAP @@ 10 cm H2O  . Omega-3 Fatty Acids (FISH OIL) 1200 MG CAPS Take 1,200 mg by mouth daily.   . Oxymetazoline HCl-Menthol (AFRIN MENTHOL SPRAY NA) Place into the nose at bedtime.  . pravastatin (PRAVACHOL) 20 MG tablet Take 1 tablet (20 mg total) by mouth daily.    PHQ 2/9 Scores 06/01/2019 05/24/2019 11/17/2018 08/06/2018  PHQ - 2 Score 0 0 0 0  PHQ- 9 Score 0 3 - -    BP Readings  from Last 3 Encounters:  06/01/19 122/62  05/24/19 120/68  01/10/19 (!) 148/84    Physical Exam Vitals signs and nursing note reviewed.  Constitutional:      Appearance: Normal appearance. He is well-developed.  HENT:     Head: Normocephalic.     Right Ear: Tympanic membrane, ear canal and external ear normal.     Left Ear: Tympanic membrane, ear canal and external ear normal.     Nose: Nose normal.     Mouth/Throat:     Pharynx: Uvula midline.  Eyes:     Conjunctiva/sclera: Conjunctivae normal.     Pupils: Pupils are equal, round, and reactive to light.  Neck:     Musculoskeletal: Normal range of motion and neck supple.     Thyroid: No thyromegaly.     Vascular: No carotid bruit.  Cardiovascular:     Rate and Rhythm: Normal rate and regular rhythm.     Heart sounds: Normal heart sounds.  Pulmonary:     Effort: Pulmonary effort is normal.  Breath sounds: Normal breath sounds. No wheezing.  Chest:     Breasts:        Right: No mass.        Left: No mass.  Abdominal:     General: Bowel sounds are normal.     Palpations: Abdomen is soft.     Tenderness: There is no abdominal tenderness.  Musculoskeletal:        General: Swelling (MCPs and wrist on right) present.     Cervical back: He exhibits decreased range of motion.     Right lower leg: No edema.     Left lower leg: No edema.  Lymphadenopathy:     Cervical: No cervical adenopathy.  Skin:    General: Skin is warm and dry.     Capillary Refill: Capillary refill takes less than 2 seconds.     Findings: No lesion.  Neurological:     Mental Status: He is alert and oriented to person, place, and time.     Sensory: Sensory deficit (tingling in fingers of right hand) present.     Motor: Motor function is intact.     Deep Tendon Reflexes:     Reflex Scores:      Bicep reflexes are 1+ on the right side and 2+ on the left side.      Patellar reflexes are 2+ on the right side and 2+ on the left side. Psychiatric:         Attention and Perception: Attention normal.        Mood and Affect: Mood normal.        Speech: Speech normal.        Behavior: Behavior normal.        Thought Content: Thought content normal.        Judgment: Judgment normal.     Wt Readings from Last 3 Encounters:  06/01/19 258 lb (117 kg)  05/24/19 256 lb (116.1 kg)  01/10/19 255 lb (115.7 kg)    BP 122/62   Pulse 80   Ht 6' (1.829 m)   Wt 258 lb (117 kg)   BMI 34.99 kg/m   Assessment and Plan: 1. Annual physical exam - POCT urinalysis dipstick  2. Essential (primary) hypertension Clinically stable exam with well controlled BP.   Tolerating medications, losartan 100 mg and metoprolol 25 mg, without side effects at this time. Pt to continue current regimen and low sodium diet; benefits of regular exercise as able discussed. - CBC with Differential/Platelet - Comprehensive metabolic panel  3. Dyslipidemia Tolerating moderate intensity statin medication without side effects at this time Continue same therapy without change at this time. - Lipid panel - pravastatin (PRAVACHOL) 20 MG tablet; Take 1 tablet (20 mg total) by mouth daily.  Dispense: 90 tablet; Refill: 3  4. Idiopathic chronic gout of foot without tophus, unspecified laterality Continue allopurinol daily preventative Continue monitor renal function - Uric acid  5. Obstructive apnea Tolerating CPAP nightly - good compliance and response to therapy  6. Prostate cancer screening DRE deferred - PSA  7. Influenza vaccine needed - Flu Vaccine QUAD 6+ mos PF IM (Fluarix Quad PF)   Partially dictated using Editor, commissioning. Any errors are unintentional.  Halina Maidens, MD [facility]  06/01/2019

## 2019-06-02 LAB — CBC WITH DIFFERENTIAL/PLATELET
Basophils Absolute: 0 10*3/uL (ref 0.0–0.2)
Basos: 1 %
EOS (ABSOLUTE): 0.3 10*3/uL (ref 0.0–0.4)
Eos: 5 %
Hematocrit: 38.2 % (ref 37.5–51.0)
Hemoglobin: 12.8 g/dL — ABNORMAL LOW (ref 13.0–17.7)
Immature Grans (Abs): 0 10*3/uL (ref 0.0–0.1)
Immature Granulocytes: 0 %
Lymphocytes Absolute: 1.5 10*3/uL (ref 0.7–3.1)
Lymphs: 31 %
MCH: 31.9 pg (ref 26.6–33.0)
MCHC: 33.5 g/dL (ref 31.5–35.7)
MCV: 95 fL (ref 79–97)
Monocytes Absolute: 0.6 10*3/uL (ref 0.1–0.9)
Monocytes: 11 %
Neutrophils Absolute: 2.6 10*3/uL (ref 1.4–7.0)
Neutrophils: 52 %
Platelets: 138 10*3/uL — ABNORMAL LOW (ref 150–450)
RBC: 4.01 x10E6/uL — ABNORMAL LOW (ref 4.14–5.80)
RDW: 13 % (ref 11.6–15.4)
WBC: 4.9 10*3/uL (ref 3.4–10.8)

## 2019-06-02 LAB — COMPREHENSIVE METABOLIC PANEL
ALT: 29 IU/L (ref 0–44)
AST: 34 IU/L (ref 0–40)
Albumin/Globulin Ratio: 2.2 (ref 1.2–2.2)
Albumin: 4.6 g/dL (ref 3.8–4.8)
Alkaline Phosphatase: 64 IU/L (ref 39–117)
BUN/Creatinine Ratio: 19 (ref 10–24)
BUN: 26 mg/dL (ref 8–27)
Bilirubin Total: 1.1 mg/dL (ref 0.0–1.2)
CO2: 23 mmol/L (ref 20–29)
Calcium: 10.1 mg/dL (ref 8.6–10.2)
Chloride: 109 mmol/L — ABNORMAL HIGH (ref 96–106)
Creatinine, Ser: 1.37 mg/dL — ABNORMAL HIGH (ref 0.76–1.27)
GFR calc Af Amer: 63 mL/min/{1.73_m2} (ref >59)
GFR calc non Af Amer: 55 mL/min/{1.73_m2} — ABNORMAL LOW (ref >59)
Globulin, Total: 2.1 g/dL (ref 1.5–4.5)
Glucose: 105 mg/dL — ABNORMAL HIGH (ref 65–99)
Potassium: 4.8 mmol/L (ref 3.5–5.2)
Sodium: 144 mmol/L (ref 134–144)
Total Protein: 6.7 g/dL (ref 6.0–8.5)

## 2019-06-02 LAB — LIPID PANEL
Chol/HDL Ratio: 4.6 ratio (ref 0.0–5.0)
Cholesterol, Total: 125 mg/dL (ref 100–199)
HDL: 27 mg/dL — ABNORMAL LOW (ref >39)
LDL Chol Calc (NIH): 61 mg/dL (ref 0–99)
Triglycerides: 229 mg/dL — ABNORMAL HIGH (ref 0–149)
VLDL Cholesterol Cal: 37 mg/dL (ref 5–40)

## 2019-06-02 LAB — PSA: Prostate Specific Ag, Serum: 1.6 ng/mL (ref 0.0–4.0)

## 2019-06-02 LAB — URIC ACID: Uric Acid: 4.4 mg/dL (ref 3.7–8.6)

## 2020-01-18 ENCOUNTER — Other Ambulatory Visit: Payer: Self-pay

## 2020-01-18 MED ORDER — METOPROLOL SUCCINATE ER 25 MG PO TB24: 25.0000 mg | ORAL_TABLET | Freq: Every day | ORAL | 0 refills | Status: DC

## 2020-01-27 ENCOUNTER — Encounter: Payer: Self-pay | Admitting: Emergency Medicine

## 2020-01-27 ENCOUNTER — Ambulatory Visit
Admission: EM | Admit: 2020-01-27 | Discharge: 2020-01-27 | Disposition: A | Discharge status: Home / Self Care | Payer: BC Managed Care – PPO | Attending: Family Medicine | Admitting: Family Medicine

## 2020-01-27 ENCOUNTER — Ambulatory Visit (INDEPENDENT_AMBULATORY_CARE_PROVIDER_SITE_OTHER): Payer: BC Managed Care – PPO

## 2020-01-27 ENCOUNTER — Other Ambulatory Visit: Payer: Self-pay

## 2020-01-27 DIAGNOSIS — S9002XA Contusion of left ankle, initial encounter: Secondary | ICD-10-CM

## 2020-01-27 NOTE — ED Provider Notes (Signed)
[facility]    CSN: [id number] Arrival date & time: 01/27/20  1748      History   Chief Complaint Chief Complaint  Patient presents with  . Ankle Injury    DOI 01/26/20   HPI  64 year old male presents the above complaint.  Patient reports that he injured his ankle yesterday.  He states that he dropped a metal extension ladder on his left ankle.  He reports mild swelling and bruising.  He denies any significant pain to me at this time.  He has neuropathy and therefore does not feel much pain or discomfort in his lower extremities.  He states that his wife wanted him to come in for evaluation to ensure that he did not have a fracture.  No other injuries.  No other associated symptoms.  Past Medical History:  Diagnosis Date  . Arthritis   . Chronic neck pain   . Coronary artery disease   . Difficult intubation   . Gout   . H/O heart artery stent   . History of hiatal hernia   . Hypertension   . Hypertension   . Nerve pain    idiopathic peripheral neuropathy  . Sleep apnea   . Sleep apnea with use of continuous positive airway pressure (CPAP)     Patient Active Problem List   Diagnosis Date Noted  . CKD (chronic kidney disease), stage III 05/25/2017  . Status post cervical spinal fusion 05/20/2016  . Nail abnormality 05/20/2016  . Mild carotid artery disease ([place]) 02/25/2015  . Tinnitus of left ear 02/12/2015  . Cervical disc disease 11/20/2014  . Tubular adenoma of colon 11/20/2014  . 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 Dyslipidemia 11/20/2014  . ED (erectile dysfunction) of organic origin 11/20/2014  . Essential (primary) hypertension 11/20/2014  . Idiopathic peripheral neuropathy 11/20/2014  . Obstructive apnea 10/08/2011  . Coronary artery disease involving native coronary artery of native heart with angina pectoris ([place]) 10/08/2011    Past Surgical History:  Procedure Laterality Date  . ANTERIOR FUSION CERVICAL SPINE   01/2016  . BILATERAL CARPAL TUNNEL RELEASE  09/2013  . COLONOSCOPY  08/14/2010   TA - rec 3 yr follow up Roxy Manns)  . CORONARY ANGIOPLASTY WITH STENT PLACEMENT  [id number]   [place]  . CORONARY ANGIOPLASTY WITH STENT PLACEMENT  06/05/2017   PCI of LCx with placement of 4.0 x 46mm DES  . DISTAL BICEPS TENDON REPAIR Right 03/30/2017   Procedure: DISTAL BICEPS TENDON REPAIR;  Surgeon: Earnestine Leys, MD;  Location: [facility] ORS;  Service: Orthopedics;  Laterality: Right;  . HYDROCELE EXCISION    . VASECTOMY         Home Medications    Prior to Admission medications   Medication Sig Start Date End Date Taking? Authorizing Provider  allopurinol (ZYLOPRIM) 100 MG tablet Take 450 mg by mouth daily.    Yes [provider]  aspirin EC 81 MG tablet Take 81 mg by mouth daily.   Yes [provider]  b complex vitamins tablet Take 1 tablet by mouth daily.   Yes [provider]  gabapentin (NEURONTIN) 300 MG capsule  05/30/19  Yes [provider]  GINKGO BILOBA PO Take 1 tablet by mouth daily.   Yes [provider]  GLUCOSAMINE HCL PO Take 1,500 mg by mouth daily.  09/26/10  Yes [provider]  HYDROcodone-acetaminophen (NORCO) 7.5-325 MG tablet Take 1 tablet by mouth 2 (two) times daily as needed (for  neck pain.).    Yes [provider]  losartan (COZAAR) 100 MG tablet TAKE 1 TABLET BY MOUTH EVERY DAY 01/24/19  Yes Glean Hess, MD  meloxicam (MOBIC) 7.5 MG tablet Take 7.5-15 mg by mouth daily as needed.    Yes [provider]  metoprolol succinate (TOPROL-XL) 25 MG 24 hr tablet Take 1 tablet (25 mg total) by mouth daily. 01/18/20  Yes Glean Hess, MD  Multiple Vitamin (MULTIVITAMIN WITH MINERALS) TABS tablet Take 1 tablet by mouth daily.   Yes [provider]  nitroGLYCERIN (NITROSTAT) 0.4 MG SL tablet PLACE 1 TABLET UNDER THE TONGUE EVERY 5 MINUTES AS NEEDED FOR CHEST PAIN 02/18/19  Yes Glean Hess, MD  NON  FORMULARY CPAP @@ 10 cm H2O   Yes [provider]  Omega-3 Fatty Acids (FISH OIL) 1200 MG CAPS Take 1,200 mg by mouth daily.    Yes [provider]  Oxymetazoline HCl-Menthol (AFRIN MENTHOL SPRAY NA) Place into the nose at bedtime.   Yes [provider]  pravastatin (PRAVACHOL) 20 MG tablet Take 1 tablet (20 mg total) by mouth daily. 06/01/19  Yes Glean Hess, MD  amitriptyline (ELAVIL) 10 MG tablet Take 10 mg by mouth at bedtime as needed. 05/14/19 01/27/20  [provider]    Family History Family History  Problem Relation Age of Onset  . Rheum arthritis Mother   . Rheumatic fever Father     Social History Social History   Tobacco Use  . Smoking status: Never Smoker  . Smokeless tobacco: Never Used  Vaping Use  . Vaping Use: Never used  Substance Use Topics  . Alcohol use: No    Alcohol/week: 0.0 standard drinks  . Drug use: No     Allergies   Statins, Levofloxacin, and Codeine   Review of Systems Review of Systems  Constitutional: Negative.   Musculoskeletal:       Left ankle bruising.   Physical Exam Triage Vital Signs ED Triage Vitals  Enc Vitals Group     BP 01/27/20 1759 131/72     Pulse Rate 01/27/20 1759 80     Resp 01/27/20 1759 18     Temp 01/27/20 1759 98 F (36.7 C)     Temp Source 01/27/20 1759 Oral     SpO2 01/27/20 1759 99 %     Weight 01/27/20 1759 246 lb (111.6 kg)     Height 01/27/20 1759 6' (1.829 m)     Head Circumference --      Peak Flow --      Pain Score 01/27/20 1758 4     Pain Loc --      Pain Edu? --      Excl. in [place]? --    Updated Vital Signs BP 131/72 (BP Location: Left Arm)   Pulse 80   Temp 98 F (36.7 C) (Oral)   Resp 18   Ht 6' (1.829 m)   Wt 111.6 kg   SpO2 99%   BMI 33.36 kg/m   Visual Acuity Right Eye Distance:   Left Eye Distance:   Bilateral Distance:    Right Eye Near:   Left Eye Near:    Bilateral Near:     Physical Exam Vitals and nursing note reviewed.    Constitutional:      General: He is not in acute distress.    Appearance: Normal appearance. He is not ill-appearing.  HENT:     Head: Normocephalic and atraumatic.  Eyes:     General:        Right eye: No discharge.        Left eye: No discharge.     Conjunctiva/sclera: Conjunctivae normal.  Cardiovascular:     Rate and Rhythm: Normal rate and regular rhythm.     Heart sounds: No murmur heard.   Pulmonary:     Effort: Pulmonary effort is normal. No respiratory distress.  Musculoskeletal:     Comments: No significant tenderness of the left ankle  Skin:    Comments: Bruising noted to the medial aspect of the left ankle.  Neurological:     Mental Status: He is alert.  Psychiatric:        Mood and Affect: Mood normal.        Behavior: Behavior normal.    UC Treatments / Results  Labs (all labs ordered are listed, but only abnormal results are displayed) Labs Reviewed - No data to display  EKG   Radiology DG Ankle Complete Left  Result Date: 01/27/2020 CLINICAL DATA:  64 year old male with trauma to the left ankle. EXAM: LEFT ANKLE COMPLETE - 3+ VIEW COMPARISON:  None. FINDINGS: There is no evidence of fracture, dislocation, or joint effusion. There is no evidence of arthropathy or other focal bone abnormality. Soft tissues are unremarkable. IMPRESSION: Negative. Electronically Signed   By: Anner Crete M.D.   On: 01/27/2020 18:26    Procedures Procedures (including critical care time)  Medications Ordered in UC Medications - No data to display  Initial Impression / Assessment and Plan / UC Course  I have reviewed the triage vital signs and the nursing notes.  Pertinent labs & imaging results that were available during my care of the patient were reviewed by me and considered in my medical decision making (see chart for details).    64 year old male presents with a contusion to the left ankle.  Patient has neuropathy and thus x-ray was obtained to rule out  fracture.  X-ray obtained and independently interpreted.  Interpretation: No acute fracture.  Advised supportive care and Tylenol as needed.  Advised ice and elevation as well.  Final Clinical Impressions(s) / UC Diagnoses   Final diagnoses:  Contusion of left ankle, initial encounter     Discharge Instructions     Ice and elevation.  Tylenol as needed.  Take care  Dr. Lacinda Axon     ED Prescriptions    None     PDMP not reviewed this encounter.   Coral Spikes, [place] 01/27/20 2123

## 2020-01-27 NOTE — ED Triage Notes (Signed)
Patient in today c/o left ankle injury on 01/26/20. Patient dropped a metal extension ladder on his left ankle. Patient now having swelling, pain and bruising.

## 2020-01-27 NOTE — Discharge Instructions (Signed)
Ice and elevation.  Tylenol as needed.  Take care  Dr. Lacinda Axon

## 2020-01-29 ENCOUNTER — Other Ambulatory Visit: Payer: Self-pay | Admitting: Internal Medicine

## 2020-01-29 NOTE — Telephone Encounter (Signed)
Requested Prescriptions  Pending Prescriptions Disp Refills  . losartan (COZAAR) 100 MG tablet [Pharmacy Med Name: LOSARTAN POTASSIUM 100 MG TAB] 90 tablet 0    Sig: TAKE 1 TABLET BY MOUTH EVERY DAY     Cardiovascular:  Angiotensin Receptor Blockers Failed - 01/29/2020 10:27 AM      Failed - Cr in normal range and within 180 days    Creatinine, Ser  Date Value Ref Range Status  06/01/2019 1.37 (H) 0.76 - 1.27 mg/dL Final         Failed - K in normal range and within 180 days    Potassium  Date Value Ref Range Status  06/01/2019 4.8 3.5 - 5.2 mmol/L Final         Failed - Valid encounter within last 6 months    Recent Outpatient Visits          8 months ago Annual physical exam   [facility] Glean Hess, MD   8 months ago Foraminal stenosis of cervical region   [facility] Glean Hess, MD   1 year ago Essential (primary) hypertension   [facility] Glean Hess, MD   1 year ago Diverticulitis of large intestine without perforation or abscess without bleeding   [facility] Glean Hess, MD   1 year ago Obstructive apnea   [facility] Glean Hess, MD      Future Appointments            In 4 months Glean Hess, MD [facility], [place] - Patient is not pregnant      Passed - Last BP in normal range    BP Readings from Last 1 Encounters:  01/27/20 131/72

## 2020-03-28 ENCOUNTER — Other Ambulatory Visit: Payer: Self-pay | Admitting: Internal Medicine

## 2020-04-21 ENCOUNTER — Other Ambulatory Visit: Payer: Self-pay | Admitting: Internal Medicine

## 2020-04-21 NOTE — Telephone Encounter (Signed)
Requested Prescriptions  Pending Prescriptions Disp Refills  . losartan (COZAAR) 100 MG tablet [Pharmacy Med Name: LOSARTAN POTASSIUM 100 MG TAB] 45 tablet 0    Sig: TAKE 1 TABLET BY MOUTH EVERY DAY     Cardiovascular:  Angiotensin Receptor Blockers Failed - 04/21/2020  8:55 AM      Failed - Cr in normal range and within 180 days    Creatinine, Ser  Date Value Ref Range Status  06/01/2019 1.37 (H) 0.76 - 1.27 mg/dL Final         Failed - K in normal range and within 180 days    Potassium  Date Value Ref Range Status  06/01/2019 4.8 3.5 - 5.2 mmol/L Final         Failed - Valid encounter within last 6 months    Recent Outpatient Visits          10 months ago Annual physical exam   [facility] Glean Hess, MD   11 months ago Foraminal stenosis of cervical region   [facility] Glean Hess, MD   1 year ago Essential (primary) hypertension   [facility] Glean Hess, MD   1 year ago Diverticulitis of large intestine without perforation or abscess without bleeding   [facility] Glean Hess, MD   1 year ago Obstructive apnea   [facility] Glean Hess, MD      Future Appointments            In 1 month Glean Hess, MD [facility] [place] - Patient is not pregnant      Passed - Last BP in normal range    BP Readings from Last 1 Encounters:  01/27/20 131/72

## 2020-05-17 ENCOUNTER — Other Ambulatory Visit: Payer: Self-pay | Admitting: Internal Medicine

## 2020-05-28 ENCOUNTER — Ambulatory Visit (INDEPENDENT_AMBULATORY_CARE_PROVIDER_SITE_OTHER): Payer: BC Managed Care – PPO | Admitting: Internal Medicine

## 2020-05-28 ENCOUNTER — Encounter: Payer: Self-pay | Admitting: Internal Medicine

## 2020-05-28 ENCOUNTER — Other Ambulatory Visit: Payer: Self-pay

## 2020-05-28 VITALS — BP 136/64 | HR 66 | Temp 97.8°F | Ht 72.0 in | Wt 252.0 lb

## 2020-05-28 DIAGNOSIS — Z1211 Encounter for screening for malignant neoplasm of colon: Secondary | ICD-10-CM

## 2020-05-28 DIAGNOSIS — R10814 Left lower quadrant abdominal tenderness: Secondary | ICD-10-CM | POA: Diagnosis not present

## 2020-05-28 DIAGNOSIS — I10 Essential (primary) hypertension: Secondary | ICD-10-CM

## 2020-05-28 NOTE — Progress Notes (Signed)
llq   Date:  05/28/2020   Name:  Brian Payne   DOB:  1956-07-17   MRN:  [id number]   Chief Complaint: Groin Pain (Started 3 month ago - When sitting or laying down the pain hurts most. Hurts in lower left area. Constant. No diarrhea, no constipation. Will get a flu shot next visit. )  GI Problem The primary symptoms include abdominal pain. Primary symptoms do not include fever, weight loss, nausea, vomiting, diarrhea, melena or arthralgias. Episode onset: started about 4 months ago. The problem has not changed since onset. The illness is also significant for constipation (slighty irregular stools). The illness does not include chills, bloating or tenesmus. Risk factors: more discomfort with sitting or reclining.    Lab Results  Component Value Date   CREATININE 1.37 (H) 06/01/2019   BUN 26 06/01/2019   NA 144 06/01/2019   K 4.8 06/01/2019   CL 109 (H) 06/01/2019   CO2 23 06/01/2019   Lab Results  Component Value Date   CHOL 125 06/01/2019   HDL 27 (L) 06/01/2019   LDLCALC 61 06/01/2019   TRIG 229 (H) 06/01/2019   CHOLHDL 4.6 06/01/2019   Lab Results  Component Value Date   TSH 1.110 01/17/2015   No results found for: HGBA1C Lab Results  Component Value Date   WBC 4.9 06/01/2019   HGB 12.8 (L) 06/01/2019   HCT 38.2 06/01/2019   MCV 95 06/01/2019   PLT 138 (L) 06/01/2019   Lab Results  Component Value Date   ALT 29 06/01/2019   AST 34 06/01/2019   ALKPHOS 64 06/01/2019   BILITOT 1.1 06/01/2019     Review of Systems  Constitutional: Negative for chills, fever, unexpected weight change and weight loss.  Respiratory: Negative for chest tightness and shortness of breath.   Cardiovascular: Positive for leg swelling (now wearing TED hose). Negative for chest pain and palpitations.  Gastrointestinal: Positive for abdominal pain and constipation (slighty irregular stools). Negative for anal bleeding, bloating, diarrhea, melena, nausea, rectal pain and vomiting.   Musculoskeletal: Negative for arthralgias.  Neurological: Negative for dizziness and headaches.    Patient Active Problem List   Diagnosis Date Noted  . CKD (chronic kidney disease), stage III ([place]) 05/25/2017  . Status post cervical spinal fusion 05/20/2016  . Nail abnormality 05/20/2016  . Mild carotid artery disease ([place]) 02/25/2015  . Tinnitus of left ear 02/12/2015  . Cervical disc disease 11/20/2014  . Tubular adenoma of colon 11/20/2014  . 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 Dyslipidemia 11/20/2014  . ED (erectile dysfunction) of organic origin 11/20/2014  . Essential (primary) hypertension 11/20/2014  . Idiopathic peripheral neuropathy 11/20/2014  . Obstructive apnea 10/08/2011  . Coronary artery disease involving native coronary artery of native heart with angina pectoris ([place]) 10/08/2011    Allergies  Allergen Reactions  . Statins Other (See Comments)    Muscle aches  . Levofloxacin     dizziness  . Codeine Itching and Rash    Past Surgical History:  Procedure Laterality Date  . ANTERIOR FUSION CERVICAL SPINE  01/2016  . BILATERAL CARPAL TUNNEL RELEASE  09/2013  . COLONOSCOPY  08/14/2010   TA - rec 3 yr follow up Roxy Manns)  . CORONARY ANGIOPLASTY WITH STENT PLACEMENT  [id number]   [place]  . CORONARY ANGIOPLASTY WITH STENT PLACEMENT  06/05/2017   PCI of LCx with placement of 4.0 x 59mm DES  . DISTAL BICEPS TENDON REPAIR Right 03/30/2017  Procedure: DISTAL BICEPS TENDON REPAIR;  Surgeon: Earnestine Leys, MD;  Location: [facility] ORS;  Service: Orthopedics;  Laterality: Right;  . HYDROCELE EXCISION    . VASECTOMY      Social History   Tobacco Use  . Smoking status: Never Smoker  . Smokeless tobacco: Never Used  Vaping Use  . Vaping Use: Never used  Substance Use Topics  . Alcohol use: No    Alcohol/week: 0.0 standard drinks  . Drug use: No     Medication list has been reviewed and updated.  Current Meds  Medication Sig  . allopurinol  (ZYLOPRIM) 100 MG tablet Take 450 mg by mouth daily.   Marland Kitchen aspirin EC 81 MG tablet Take 81 mg by mouth daily.  Marland Kitchen b complex vitamins tablet Take 1 tablet by mouth daily.  Marland Kitchen gabapentin (NEURONTIN) 100 MG capsule Take 100 mg by mouth 2 (two) times daily.   Marland Kitchen GINKGO BILOBA PO Take 1 tablet by mouth daily.  Marland Kitchen GLUCOSAMINE HCL PO Take 1,500 mg by mouth daily.   Marland Kitchen HYDROcodone-acetaminophen (NORCO) 7.5-325 MG tablet Take 1 tablet by mouth 2 (two) times daily as needed (for neck pain.).   Marland Kitchen losartan (COZAAR) 100 MG tablet TAKE 1 TABLET BY MOUTH EVERY DAY  . meloxicam (MOBIC) 7.5 MG tablet Take 7.5-15 mg by mouth daily as needed.   . metoprolol succinate (TOPROL-XL) 25 MG 24 hr tablet TAKE 1 TABLET BY MOUTH EVERY DAY  . Multiple Vitamin (MULTIVITAMIN WITH MINERALS) TABS tablet Take 1 tablet by mouth daily.  . nitroGLYCERIN (NITROSTAT) 0.4 MG SL tablet PLACE 1 TABLET UNDER THE TONGUE EVERY 5 MINUTES AS NEEDED FOR CHEST PAIN  . NON FORMULARY CPAP @@ 10 cm H2O  . Omega-3 Fatty Acids (FISH OIL) 1200 MG CAPS Take 1,200 mg by mouth daily.   . Oxymetazoline HCl-Menthol (AFRIN MENTHOL SPRAY NA) Place into the nose at bedtime.  . pravastatin (PRAVACHOL) 20 MG tablet Take 1 tablet (20 mg total) by mouth daily.    PHQ 2/9 Scores 05/28/2020 06/01/2019 05/24/2019 11/17/2018  PHQ - 2 Score 0 0 0 0  PHQ- 9 Score 0 0 3 -    GAD 7 : Generalized Anxiety Score 05/28/2020  Nervous, Anxious, on Edge 0  Control/stop worrying 0  Worry too much - different things 0  Trouble relaxing 0  Restless 0  Easily annoyed or irritable 0  Afraid - awful might happen 0  Total GAD 7 Score 0  Anxiety Difficulty Not difficult at all    BP Readings from Last 3 Encounters:  05/28/20 136/64  01/27/20 131/72  06/01/19 122/62    Physical Exam Vitals and nursing note reviewed.  Constitutional:      General: He is not in acute distress.    Appearance: Normal appearance. He is well-developed.  HENT:     Head: Normocephalic and  atraumatic.  Cardiovascular:     Rate and Rhythm: Normal rate and regular rhythm.  Pulmonary:     Effort: Pulmonary effort is normal. No respiratory distress.     Breath sounds: No wheezing or rhonchi.  Abdominal:     General: Abdomen is protuberant. Bowel sounds are normal. There is no distension or abdominal bruit. There are no signs of injury.     Palpations: Abdomen is soft. There is no hepatomegaly, splenomegaly or pulsatile mass.     Tenderness: There is abdominal tenderness in the left lower quadrant. There is no right CVA tenderness, left CVA tenderness or guarding. Negative signs include  Murphy's sign.     Hernia: No hernia is present.  Musculoskeletal:        General: Normal range of motion.  Skin:    General: Skin is warm and dry.     Findings: No rash.  Neurological:     Mental Status: He is alert and oriented to person, place, and time.  Psychiatric:        Behavior: Behavior normal.        Thought Content: Thought content normal.     Wt Readings from Last 3 Encounters:  05/28/20 252 lb (114.3 kg)  01/27/20 246 lb (111.6 kg)  06/01/19 258 lb (117 kg)    BP 136/64   Pulse 66   Temp 97.8 F (36.6 C) (Oral)   Ht 6' (1.829 m)   Wt 252 lb (114.3 kg)   SpO2 97%   BMI 34.18 kg/m   Assessment and Plan: 1. Left lower quadrant abdominal tenderness without rebound tenderness Suggestive of mild constipation - recommend Miralax daily Refer to GI for consultation - Ambulatory referral to Gastroenterology  2. Essential (primary) hypertension Controlled on medications  3. Colon cancer screening Due in Jan 2022 for colonoscopy Can discuss with GI at consult - Ambulatory referral to Gastroenterology   Partially dictated using Dragon software. Any errors are unintentional.  Halina Maidens, MD [facility]  05/28/2020

## 2020-05-29 ENCOUNTER — Telehealth: Payer: Self-pay

## 2020-05-29 NOTE — Telephone Encounter (Signed)
Called pt let him know a RF was placed 05/29/2020. Pt verbalized understanding.  KP

## 2020-05-29 NOTE — Telephone Encounter (Signed)
Copied from [place] [id number]. Topic: Referral - Request for Referral >> May 29, 2020 10:38 AM Erick Blinks wrote: Has patient seen PCP for this complaint? Yes.   *If NO, is insurance requiring patient see PCP for this issue before PCP can refer them? Referral for which specialty: Gastroenterology  Preferred provider/office: Dr. Raylene Miyamoto, [facility]  Reason for referral: Intestinal issues

## 2020-06-05 ENCOUNTER — Encounter: Payer: Self-pay | Admitting: Internal Medicine

## 2020-06-05 ENCOUNTER — Other Ambulatory Visit: Payer: Self-pay

## 2020-06-05 ENCOUNTER — Ambulatory Visit (INDEPENDENT_AMBULATORY_CARE_PROVIDER_SITE_OTHER): Payer: BC Managed Care – PPO | Admitting: Internal Medicine

## 2020-06-05 VITALS — BP 122/74 | HR 76 | Temp 98.1°F | Ht 72.0 in | Wt 248.0 lb

## 2020-06-05 DIAGNOSIS — Z23 Encounter for immunization: Secondary | ICD-10-CM | POA: Diagnosis not present

## 2020-06-05 DIAGNOSIS — E785 Hyperlipidemia, unspecified: Secondary | ICD-10-CM | POA: Diagnosis not present

## 2020-06-05 DIAGNOSIS — I10 Essential (primary) hypertension: Secondary | ICD-10-CM

## 2020-06-05 DIAGNOSIS — Z0000 Encounter for general adult medical examination without abnormal findings: Secondary | ICD-10-CM

## 2020-06-05 DIAGNOSIS — G609 Hereditary and idiopathic neuropathy, unspecified: Secondary | ICD-10-CM

## 2020-06-05 DIAGNOSIS — I779 Disorder of arteries and arterioles, unspecified: Secondary | ICD-10-CM

## 2020-06-05 DIAGNOSIS — F5101 Primary insomnia: Secondary | ICD-10-CM

## 2020-06-05 DIAGNOSIS — M1A0790 Idiopathic chronic gout, unspecified ankle and foot, without tophus (tophi): Secondary | ICD-10-CM | POA: Diagnosis not present

## 2020-06-05 DIAGNOSIS — Z125 Encounter for screening for malignant neoplasm of prostate: Secondary | ICD-10-CM

## 2020-06-05 DIAGNOSIS — N1831 Chronic kidney disease, stage 3a: Secondary | ICD-10-CM | POA: Diagnosis not present

## 2020-06-05 DIAGNOSIS — I2510 Atherosclerotic heart disease of native coronary artery without angina pectoris: Secondary | ICD-10-CM

## 2020-06-05 LAB — POCT URINALYSIS DIPSTICK
Bilirubin, UA: NEGATIVE
Blood, UA: NEGATIVE
Glucose, UA: NEGATIVE
Ketones, UA: NEGATIVE
Leukocytes, UA: NEGATIVE
Nitrite, UA: NEGATIVE
Protein, UA: NEGATIVE
Spec Grav, UA: 1.01 (ref 1.010–1.025)
Urobilinogen, UA: 0.2 E.U./dL
pH, UA: 5 (ref 5.0–8.0)

## 2020-06-05 MED ORDER — LOSARTAN POTASSIUM 100 MG PO TABS: 100.0000 mg | ORAL_TABLET | Freq: Every day | ORAL | 3 refills | Status: DC

## 2020-06-05 MED ORDER — NITROGLYCERIN 0.4 MG SL SUBL: 0.4000 mg | SUBLINGUAL_TABLET | SUBLINGUAL | 0 refills | Status: AC | PRN

## 2020-06-05 MED ORDER — METOPROLOL SUCCINATE ER 25 MG PO TB24: 25.0000 mg | ORAL_TABLET | Freq: Every day | ORAL | 3 refills | Status: DC

## 2020-06-05 MED ORDER — PRAVASTATIN SODIUM 20 MG PO TABS: 20.0000 mg | ORAL_TABLET | Freq: Every day | ORAL | 3 refills | Status: DC

## 2020-06-05 MED ORDER — TEMAZEPAM 15 MG PO CAPS: 15.0000 mg | ORAL_CAPSULE | Freq: Every evening | ORAL | 0 refills | Status: DC | PRN

## 2020-06-05 NOTE — Progress Notes (Signed)
Date:  06/05/2020   Name:  Brian Payne   DOB:  07/09/1956   MRN:  [id number]   Chief Complaint: Annual Exam he has applied for disability - currently on LTD from his company and was denied by [employer].  Now has lawyers working on it.  It is causing some anxiety and frustration.  Brian Payne is a 64 y.o. male who presents today for his Complete Annual Exam. He feels well. He reports exercising - working out in the basement of his home 2 x week. He reports he is sleeping poorly- has trouble falling asleep.   Colonoscopy: 08/2010 due soon. Has appt with [facility] 08/20/20.  Immunization History  Administered Date(s) Administered  . Influenza,inj,Quad PF,6+ Mos 04/25/2015, 05/20/2016, 05/22/2017, 05/25/2018, 06/01/2019  . Moderna SARS-COVID-2 Vaccination 02/02/2020, 03/04/2020  . Tdap 01/01/2014, 08/28/2018  . Zoster 09/04/2011    Hypertension This is a chronic problem. The problem is controlled. Associated symptoms include neck pain. Pertinent negatives include no chest pain, headaches, palpitations or shortness of breath. Past treatments include beta blockers and angiotensin blockers. The current treatment provides significant improvement. Hypertensive end-organ damage includes CAD/MI.  Hyperlipidemia This is a chronic problem. Associated symptoms include myalgias (muscle cramps in hands esp). Pertinent negatives include no chest pain or shortness of breath. Current antihyperlipidemic treatment includes statins (tolerating pravachol). Risk factors: hx of CAD/ also carotid stenosis.  CKD stage 3a - being monitored; recommending to limit nsaids and allopurinol but allopurinol necessary for control of chronic gout.  He also has chronic neck pain, LE edema and recurrent gout, bilateral hand pain, joint swelling and cramping.  Lab Results  Component Value Date   CREATININE 1.37 (H) 06/01/2019   BUN 26 06/01/2019   NA 144 06/01/2019   K 4.8 06/01/2019   CL 109 (H) 06/01/2019   CO2 23  06/01/2019   Lab Results  Component Value Date   CHOL 125 06/01/2019   HDL 27 (L) 06/01/2019   LDLCALC 61 06/01/2019   TRIG 229 (H) 06/01/2019   CHOLHDL 4.6 06/01/2019   Lab Results  Component Value Date   TSH 1.110 01/17/2015   No results found for: HGBA1C Lab Results  Component Value Date   WBC 4.9 06/01/2019   HGB 12.8 (L) 06/01/2019   HCT 38.2 06/01/2019   MCV 95 06/01/2019   PLT 138 (L) 06/01/2019   Lab Results  Component Value Date   ALT 29 06/01/2019   AST 34 06/01/2019   ALKPHOS 64 06/01/2019   BILITOT 1.1 06/01/2019   Lab Results  Component Value Date   LABURIC 4.4 06/01/2019      Review of Systems  Constitutional: Negative for appetite change, chills, diaphoresis, fatigue and unexpected weight change.  HENT: Negative for hearing loss, tinnitus, trouble swallowing and voice change.   Eyes: Negative for visual disturbance.  Respiratory: Negative for choking, shortness of breath and wheezing.   Cardiovascular: Negative for chest pain, palpitations and leg swelling.  Gastrointestinal: Positive for abdominal pain (discomfort). Negative for blood in stool, constipation and diarrhea.  Genitourinary: Negative for difficulty urinating, dysuria, frequency and urgency.  Musculoskeletal: Positive for arthralgias (esp left knee with exercise), gait problem, joint swelling, myalgias (muscle cramps in hands esp), neck pain and neck stiffness. Negative for back pain.  Skin: Negative for color change and rash.  Neurological: Positive for numbness (and tingling in feet). Negative for dizziness, syncope and headaches.  Hematological: Negative for adenopathy.  Psychiatric/Behavioral: Positive for sleep disturbance. Negative for  dysphoric mood. The patient is not nervous/anxious.     Patient Active Problem List   Diagnosis Date Noted  . CKD (chronic kidney disease), stage III ([place]) 05/25/2017  . Status post cervical spinal fusion 05/20/2016  . Nail abnormality 05/20/2016   . Mild carotid artery disease ([place]) 02/25/2015  . Tinnitus of left ear 02/12/2015  . Cervical disc disease 11/20/2014  . Tubular adenoma of colon 11/20/2014  . 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 Dyslipidemia 11/20/2014  . ED (erectile dysfunction) of organic origin 11/20/2014  . Essential (primary) hypertension 11/20/2014  . Idiopathic peripheral neuropathy 11/20/2014  . Obstructive apnea 10/08/2011  . Coronary artery disease involving native coronary artery of native heart without angina pectoris 10/08/2011    Allergies  Allergen Reactions  . Statins Other (See Comments)    Muscle aches  . Levofloxacin     dizziness  . Codeine Itching and Rash    Past Surgical History:  Procedure Laterality Date  . ANTERIOR FUSION CERVICAL SPINE  01/2016  . BILATERAL CARPAL TUNNEL RELEASE  09/2013  . COLONOSCOPY  08/14/2010   TA - rec 3 yr follow up Brian Payne)  . CORONARY ANGIOPLASTY WITH STENT PLACEMENT  [id number]   Brian Payne  . CORONARY ANGIOPLASTY WITH STENT PLACEMENT  06/05/2017   PCI of LCx with placement of 4.0 x 48mm DES  . DISTAL BICEPS TENDON REPAIR Right 03/30/2017   Procedure: DISTAL BICEPS TENDON REPAIR;  Surgeon: Brian Leys, MD;  Location: [facility] ORS;  Service: Orthopedics;  Laterality: Right;  . HYDROCELE EXCISION    . VASECTOMY      Social History   Tobacco Use  . Smoking status: Never Smoker  . Smokeless tobacco: Never Used  Vaping Use  . Vaping Use: Never used  Substance Use Topics  . Alcohol use: No    Alcohol/week: 0.0 standard drinks  . Drug use: No     Medication list has been reviewed and updated.  Current Meds  Medication Sig  . allopurinol (ZYLOPRIM) 100 MG tablet Take 450 mg by mouth daily.   Marland Kitchen aspirin EC 81 MG tablet Take 81 mg by mouth daily.  Marland Kitchen b complex vitamins tablet Take 1 tablet by mouth daily.  Marland Kitchen gabapentin (NEURONTIN) 100 MG capsule Take 100 mg by mouth 2 (two) times daily.   Marland Kitchen GINKGO BILOBA PO Take 1 tablet by mouth  daily.  Marland Kitchen GLUCOSAMINE HCL PO Take 1,500 mg by mouth daily.   Marland Kitchen HYDROcodone-acetaminophen (NORCO) 7.5-325 MG tablet Take 1 tablet by mouth 2 (two) times daily as needed (for neck pain.).   Marland Kitchen losartan (COZAAR) 100 MG tablet TAKE 1 TABLET BY MOUTH EVERY DAY  . meloxicam (MOBIC) 7.5 MG tablet Take 7.5-15 mg by mouth daily as needed.   . metoprolol succinate (TOPROL-XL) 25 MG 24 hr tablet TAKE 1 TABLET BY MOUTH EVERY DAY  . Multiple Vitamin (MULTIVITAMIN WITH MINERALS) TABS tablet Take 1 tablet by mouth daily.  . nitroGLYCERIN (NITROSTAT) 0.4 MG SL tablet PLACE 1 TABLET UNDER THE TONGUE EVERY 5 MINUTES AS NEEDED FOR CHEST PAIN  . NON FORMULARY CPAP @@ 10 cm H2O  . Omega-3 Fatty Acids (FISH OIL) 1200 MG CAPS Take 1,200 mg by mouth daily.   . Oxymetazoline HCl-Menthol (AFRIN MENTHOL SPRAY NA) Place into the nose at bedtime.  . pravastatin (PRAVACHOL) 20 MG tablet Take 1 tablet (20 mg total) by mouth daily.    PHQ 2/9 Scores 06/05/2020 05/28/2020 06/01/2019 05/24/2019  PHQ - 2 Score  0 0 0 0  PHQ- 9 Score 6 0 0 3    GAD 7 : Generalized Anxiety Score 06/05/2020 05/28/2020  Nervous, Anxious, on Edge 1 0  Control/stop worrying 1 0  Worry too much - different things 1 0  Trouble relaxing 0 0  Restless 0 0  Easily annoyed or irritable 0 0  Afraid - awful might happen 0 0  Total GAD 7 Score 3 0  Anxiety Difficulty Not difficult at all Not difficult at all    BP Readings from Last 3 Encounters:  06/05/20 122/74  05/28/20 136/64  01/27/20 131/72    Physical Exam Vitals and nursing note reviewed.  Constitutional:      Appearance: Normal appearance. He is well-developed.  HENT:     Head: Normocephalic.     Right Ear: Tympanic membrane, ear canal and external ear normal.     Left Ear: Tympanic membrane, ear canal and external ear normal.     Nose: Nose normal.     Mouth/Throat:     Pharynx: Uvula midline.  Eyes:     Conjunctiva/sclera: Conjunctivae normal.     Pupils: Pupils are equal,  round, and reactive to light.  Neck:     Thyroid: No thyromegaly.     Vascular: No carotid bruit.  Cardiovascular:     Rate and Rhythm: Normal rate and regular rhythm.     Heart sounds: Normal heart sounds.  Pulmonary:     Effort: Pulmonary effort is normal.     Breath sounds: Normal breath sounds. No wheezing.  Chest:     Breasts:        Right: No mass.        Left: No mass.  Abdominal:     General: Bowel sounds are normal.     Palpations: Abdomen is soft.     Tenderness: There is no abdominal tenderness.  Musculoskeletal:     Right hand: Swelling and tenderness present. Decreased range of motion.     Left hand: Swelling and tenderness present. Decreased range of motion.     Cervical back: Neck supple. Spasms present. Decreased range of motion.     Right foot: Swelling present.  Lymphadenopathy:     Cervical: No cervical adenopathy.  Skin:    General: Skin is warm and dry.  Neurological:     Mental Status: He is alert and oriented to person, place, and time.     Deep Tendon Reflexes: Reflexes are normal and symmetric.  Psychiatric:        Speech: Speech normal.        Behavior: Behavior normal.        Thought Content: Thought content normal.        Judgment: Judgment normal.     Wt Readings from Last 3 Encounters:  06/05/20 248 lb (112.5 kg)  05/28/20 252 lb (114.3 kg)  01/27/20 246 lb (111.6 kg)    BP 122/74   Pulse 76   Temp 98.1 F (36.7 C) (Oral)   Ht 6' (1.829 m)   Wt 248 lb (112.5 kg)   SpO2 98%   BMI 33.63 kg/m   Assessment and Plan: 1. Annual physical exam Continue efforts at exercise, healthy diet choices - POCT urinalysis dipstick  2. Prostate cancer screening DRE deferred to lack of sx - PSA  3. Essential (primary) hypertension Clinically stable exam with well controlled BP. Tolerating medications without side effects at this time. Pt to continue current regimen and low sodium diet; benefits  of regular exercise as able discussed. - CBC  with Differential/Platelet - metoprolol succinate (TOPROL-XL) 25 MG 24 hr tablet; Take 1 tablet (25 mg total) by mouth daily.  Dispense: 90 tablet; Refill: 3 - losartan (COZAAR) 100 MG tablet; Take 1 tablet (100 mg total) by mouth daily.  Dispense: 90 tablet; Refill: 3  4. Dyslipidemia On low intensity statin therapy due to intolerance of others - Lipid panel - pravastatin (PRAVACHOL) 20 MG tablet; Take 1 tablet (20 mg total) by mouth daily.  Dispense: 90 tablet; Refill: 3  5. Stage 3a chronic kidney disease (HCC) Continue to monitor Avoid all nsaids/requires allopurinol due to chronic gout - Comprehensive metabolic panel  6. 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 Mild carotid artery disease ([place]) Pt wants to defer [place] at this time He will discuss it with his cardiologist  8. Need for immunization against influenza - Flu Vaccine QUAD 36+ mos IM  9. Idiopathic peripheral neuropathy On gabapentin from Neurology Did not tolerate Cymbalta due to urinary retention  10. Arteriosclerosis of coronary artery No angina by patient report but keeps NTG on hand - nitroGLYCERIN (NITROSTAT) 0.4 MG SL tablet; Place 1 tablet (0.4 mg total) under the tongue every 5 (five) minutes as needed for chest pain.  Dispense: 30 tablet; Refill: 0  11. Primary insomnia Newer problem - likely related to disability application and denial, etc No benefit from Melatonin or Z-quil. - temazepam (RESTORIL) 15 MG capsule; Take 1 capsule (15 mg total) by mouth at bedtime as needed for sleep.  Dispense: 30 capsule; Refill: 0   Partially dictated using Editor, commissioning. Any errors are unintentional.  Halina Maidens, MD [facility]  06/05/2020

## 2020-06-06 ENCOUNTER — Encounter: Payer: Self-pay | Admitting: Internal Medicine

## 2020-06-06 DIAGNOSIS — R972 Elevated prostate specific antigen [PSA]: Secondary | ICD-10-CM | POA: Insufficient documentation

## 2020-06-06 LAB — COMPREHENSIVE METABOLIC PANEL
ALT: 19 IU/L (ref 0–44)
AST: 24 IU/L (ref 0–40)
Albumin/Globulin Ratio: 2.4 — ABNORMAL HIGH (ref 1.2–2.2)
Albumin: 4.7 g/dL (ref 3.8–4.8)
Alkaline Phosphatase: 67 IU/L (ref 44–121)
BUN/Creatinine Ratio: 20 (ref 10–24)
BUN: 29 mg/dL — ABNORMAL HIGH (ref 8–27)
Bilirubin Total: 0.6 mg/dL (ref 0.0–1.2)
CO2: 23 mmol/L (ref 20–29)
Calcium: 9.8 mg/dL (ref 8.6–10.2)
Chloride: 105 mmol/L (ref 96–106)
Creatinine, Ser: 1.48 mg/dL — ABNORMAL HIGH (ref 0.76–1.27)
GFR calc Af Amer: 57 mL/min/{1.73_m2} — ABNORMAL LOW (ref >59)
GFR calc non Af Amer: 49 mL/min/{1.73_m2} — ABNORMAL LOW (ref >59)
Globulin, Total: 2 g/dL (ref 1.5–4.5)
Glucose: 106 mg/dL — ABNORMAL HIGH (ref 65–99)
Potassium: 4.9 mmol/L (ref 3.5–5.2)
Sodium: 141 mmol/L (ref 134–144)
Total Protein: 6.7 g/dL (ref 6.0–8.5)

## 2020-06-06 LAB — CBC WITH DIFFERENTIAL/PLATELET
Basophils Absolute: 0 10*3/uL (ref 0.0–0.2)
Basos: 1 %
EOS (ABSOLUTE): 0.3 10*3/uL (ref 0.0–0.4)
Eos: 5 %
Hematocrit: 38.8 % (ref 37.5–51.0)
Hemoglobin: 13.4 g/dL (ref 13.0–17.7)
Immature Grans (Abs): 0 10*3/uL (ref 0.0–0.1)
Immature Granulocytes: 0 %
Lymphocytes Absolute: 1.6 10*3/uL (ref 0.7–3.1)
Lymphs: 27 %
MCH: 32.6 pg (ref 26.6–33.0)
MCHC: 34.5 g/dL (ref 31.5–35.7)
MCV: 94 fL (ref 79–97)
Monocytes Absolute: 0.7 10*3/uL (ref 0.1–0.9)
Monocytes: 11 %
Neutrophils Absolute: 3.4 10*3/uL (ref 1.4–7.0)
Neutrophils: 56 %
Platelets: 156 10*3/uL (ref 150–450)
RBC: 4.11 x10E6/uL — ABNORMAL LOW (ref 4.14–5.80)
RDW: 13.2 % (ref 11.6–15.4)
WBC: 6 10*3/uL (ref 3.4–10.8)

## 2020-06-06 LAB — LIPID PANEL
Chol/HDL Ratio: 5.3 ratio — ABNORMAL HIGH (ref 0.0–5.0)
Cholesterol, Total: 155 mg/dL (ref 100–199)
HDL: 29 mg/dL — ABNORMAL LOW (ref >39)
LDL Chol Calc (NIH): 81 mg/dL (ref 0–99)
Triglycerides: 272 mg/dL — ABNORMAL HIGH (ref 0–149)
VLDL Cholesterol Cal: 45 mg/dL — ABNORMAL HIGH (ref 5–40)

## 2020-06-06 LAB — URIC ACID: Uric Acid: 4.6 mg/dL (ref 3.8–8.4)

## 2020-06-06 LAB — PSA: Prostate Specific Ag, Serum: 2.5 ng/mL (ref 0.0–4.0)

## 2020-10-04 ENCOUNTER — Other Ambulatory Visit: Payer: Self-pay

## 2020-10-04 ENCOUNTER — Ambulatory Visit
Admission: EM | Admit: 2020-10-04 | Discharge: 2020-10-04 | Disposition: A | Discharge status: Home / Self Care | Payer: BC Managed Care – PPO | Attending: Family Medicine | Admitting: Family Medicine

## 2020-10-04 DIAGNOSIS — S91331A Puncture wound without foreign body, right foot, initial encounter: Secondary | ICD-10-CM | POA: Diagnosis not present

## 2020-10-04 DIAGNOSIS — L089 Local infection of the skin and subcutaneous tissue, unspecified: Secondary | ICD-10-CM | POA: Diagnosis not present

## 2020-10-04 MED ORDER — CIPROFLOXACIN HCL 500 MG PO TABS: 500.0000 mg | ORAL_TABLET | Freq: Two times a day (BID) | ORAL | 0 refills | Status: DC

## 2020-10-04 MED ORDER — DOXYCYCLINE HYCLATE 100 MG PO CAPS: 100.0000 mg | ORAL_CAPSULE | Freq: Two times a day (BID) | ORAL | 0 refills | Status: DC

## 2020-10-04 NOTE — ED Triage Notes (Signed)
Patient states that he is here because he stepped on a nail last night and went in to right foot. Reports that area has been painful and he did have chills. Reports that he does have neuropathy in his feet. Last Tdap was 08/28/2018.

## 2020-10-04 NOTE — ED Provider Notes (Signed)
[facility]    CSN: [id number] Arrival date & time: 10/04/20  1121      History   Chief Complaint Chief Complaint  Patient presents with  . Puncture Wound   HPI   65 year old Payne presents with the above complaint.  Patient states that he stepped on a nail last night.  He was wearing a shoe.  It went directly through the shoe and punctured the plantar aspect of his right foot.  He has had pain at the area.  Last night had chills.  No documented fever.  Tetanus is up-to-date.  His last tetanus was 08/28/2018.  No relieving factors.  No other associated symptoms.  No other complaints.  Past Medical History:  Diagnosis Date  . Arthritis   . Chronic neck pain   . Coronary artery disease   . Difficult intubation   . Gout   . H/O heart artery stent   . History of hiatal hernia   . Hypertension   . Hypertension   . Nerve pain    idiopathic peripheral neuropathy  . Sleep apnea   . Sleep apnea with use of continuous positive airway pressure (CPAP)     Patient Active Problem List   Diagnosis Date Noted  . Increased prostate specific antigen (PSA) velocity 06/06/2020  . CKD (chronic kidney disease), stage III ([place]) 05/25/2017  . Status post cervical spinal fusion 05/20/2016  . Nail abnormality 05/20/2016  . Mild carotid artery disease ([place]) 02/25/2015  . Tinnitus of left ear 02/12/2015  . Cervical disc disease 11/20/2014  . Tubular adenoma of colon 11/20/2014  . 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 Dyslipidemia 11/20/2014  . ED (erectile dysfunction) of organic origin 11/20/2014  . Essential (primary) hypertension 11/20/2014  . Idiopathic peripheral neuropathy 11/20/2014  . Obstructive apnea 10/08/2011  . Coronary artery disease involving native coronary artery of native heart without angina pectoris 10/08/2011    Past Surgical History:  Procedure Laterality Date  . ANTERIOR FUSION CERVICAL SPINE  01/2016  . BILATERAL CARPAL TUNNEL  RELEASE  09/2013  . COLONOSCOPY  08/14/2010   TA - rec 3 yr follow up Roxy Manns)  . CORONARY ANGIOPLASTY WITH STENT PLACEMENT  [id number]   [place]  . CORONARY ANGIOPLASTY WITH STENT PLACEMENT  06/05/2017   PCI of LCx with placement of 4.0 x 51mm DES  . DISTAL BICEPS TENDON REPAIR Right 03/30/2017   Procedure: DISTAL BICEPS TENDON REPAIR;  Surgeon: Earnestine Leys, MD;  Location: [facility] ORS;  Service: Orthopedics;  Laterality: Right;  . HYDROCELE EXCISION    . VASECTOMY         Home Medications    Prior to Admission medications   Medication Sig Start Date End Date Taking? Authorizing Provider  allopurinol (ZYLOPRIM) 100 MG tablet Take 450 mg by mouth daily.    Yes [provider]  aspirin EC 81 MG tablet Take 81 mg by mouth daily.   Yes [provider]  b complex vitamins tablet Take 1 tablet by mouth daily.   Yes [provider]  gabapentin (NEURONTIN) 100 MG capsule Take 100 mg by mouth 2 (two) times daily.  05/25/20  Yes [provider]  GINKGO BILOBA PO Take 1 tablet by mouth daily.   Yes [provider]  GLUCOSAMINE HCL PO Take 1,500 mg by mouth daily.  09/26/10  Yes [provider]  HYDROcodone-acetaminophen (NORCO) 7.5-325 MG tablet Take 1 tablet by mouth 2 (two) times daily as needed (for neck  pain.).    Yes [provider]  losartan (COZAAR) 100 MG tablet Take 1 tablet (100 mg total) by mouth daily. 06/05/20  Yes Glean Hess, MD  meloxicam (MOBIC) 7.5 MG tablet Take 7.5-15 mg by mouth daily as needed.    Yes [provider]  metoprolol succinate (TOPROL-XL) 25 MG 24 hr tablet Take 1 tablet (25 mg total) by mouth daily. 06/05/20  Yes Glean Hess, MD  Multiple Vitamin (MULTIVITAMIN WITH MINERALS) TABS tablet Take 1 tablet by mouth daily.   Yes [provider]  nitroGLYCERIN (NITROSTAT) 0.4 MG SL tablet Place 1 tablet (0.4 mg total) under the tongue every 5 (five) minutes as needed for chest pain. 06/05/20   Yes Glean Hess, MD  NON FORMULARY CPAP @@ 10 cm H2O   Yes [provider]  Omega-3 Fatty Acids (FISH OIL) 1200 MG CAPS Take 1,200 mg by mouth daily.    Yes [provider]  Oxymetazoline HCl-Menthol (AFRIN MENTHOL SPRAY NA) Place into the nose at bedtime.   Yes [provider]  pravastatin (PRAVACHOL) 20 MG tablet Take 1 tablet (20 mg total) by mouth daily. 06/05/20  Yes Glean Hess, MD  temazepam (RESTORIL) 15 MG capsule Take 1 capsule (15 mg total) by mouth at bedtime as needed for sleep. 06/05/20  Yes Glean Hess, MD  ciprofloxacin (CIPRO) 500 MG tablet Take 1 tablet (500 mg total) by mouth 2 (two) times daily. 10/04/20   Coral Spikes, DO  doxycycline (VIBRAMYCIN) 100 MG capsule Take 1 capsule (100 mg total) by mouth 2 (two) times daily. 10/04/20   Coral Spikes, DO    Family History Family History  Problem Relation Age of Onset  . Rheum arthritis Mother   . Rheumatic fever Father     Social History Social History   Tobacco Use  . Smoking status: Never Smoker  . Smokeless tobacco: Never Used  Vaping Use  . Vaping Use: Never used  Substance Use Topics  . Alcohol use: No    Alcohol/week: 0.0 standard drinks  . Drug use: No     Allergies   Statins, Levofloxacin, and Codeine   Review of Systems Review of Systems  Constitutional: Positive for chills.  Skin: Positive for wound.   Physical Exam Triage Vital Signs ED Triage Vitals  Enc Vitals Group     BP 10/04/20 1138 131/71     Pulse Rate 10/04/20 1138 96     Resp 10/04/20 1138 18     Temp 10/04/20 1138 98.3 F (36.8 C)     Temp Source 10/04/20 1138 Oral     SpO2 10/04/20 1138 99 %     Weight 10/04/20 1139 250 lb (113.4 kg)     Height 10/04/20 1139 6' (1.829 m)     Head Circumference --      Peak Flow --      Pain Score 10/04/20 1139 4     Pain Loc --      Pain Edu? --      Excl. in [place]? --    Updated Vital Signs BP 131/71 (BP Location: Left Arm)   Pulse 96   Temp  98.3 F (36.8 C) (Oral)   Resp 18   Ht 6' (1.829 m)   Wt 113.4 kg   SpO2 99%   BMI 33.91 kg/m   Visual Acuity Right Eye Distance:   Left Eye Distance:   Bilateral Distance:    Right Eye Near:  Left Eye Near:    Bilateral Near:     Physical Exam Vitals and nursing note reviewed.  Constitutional:      General: He is not in acute distress.    Appearance: Normal appearance. He is not ill-appearing.  HENT:     Head: Normocephalic and atraumatic.  Eyes:     General:        Right eye: No discharge.        Left eye: No discharge.     Conjunctiva/sclera: Conjunctivae normal.  Pulmonary:     Effort: Pulmonary effort is normal. No respiratory distress.  Musculoskeletal:       Feet:  Feet:     Comments: Plantar puncture wound noted.  Surrounding erythema and warmth.  Extends to the dorsal aspect of the foot.  No drainage. Neurological:     Mental Status: He is alert.  Psychiatric:        Mood and Affect: Mood normal.        Behavior: Behavior normal.    UC Treatments / Results  Labs (all labs ordered are listed, but only abnormal results are displayed) Labs Reviewed - No data to display  EKG   Radiology No results found.  Procedures Procedures (including critical care time)  Medications Ordered in UC Medications - No data to display  Initial Impression / Assessment and Plan / UC Course  I have reviewed the triage vital signs and the nursing notes.  Pertinent labs & imaging results that were available during my care of the patient were reviewed by me and considered in my medical decision making (see chart for details).    65 year old Payne presents with plantar puncture wound with infection.  Has current cellulitis.  Placing on Cipro and doxycycline.  Patient has an intolerance to Levaquin.  I spoke with pharmacy.  Placing on Cipro to cover for Pseudomonas.  Doxy to cover for staph and strep.  Advised to go to the hospital if he fails improve or worsens.  Final  Clinical Impressions(s) / UC Diagnoses   Final diagnoses:  Puncture wound of plantar aspect of right foot with infection, initial encounter     Discharge Instructions     If you worsen, please go to the ER.  Antibiotics as prescribed.  Take care  Dr. Lacinda Axon    ED Prescriptions    Medication Sig Dispense Auth. Provider   ciprofloxacin (CIPRO) 500 MG tablet  (Status: Discontinued) Take 1 tablet (500 mg total) by mouth 2 (two) times daily. 14 tablet Kamelia Lampkins G, DO   doxycycline (VIBRAMYCIN) 100 MG capsule  (Status: Discontinued) Take 1 capsule (100 mg total) by mouth 2 (two) times daily. 14 capsule Pearson Picou G, DO   ciprofloxacin (CIPRO) 500 MG tablet Take 1 tablet (500 mg total) by mouth 2 (two) times daily. 14 tablet Armour Villanueva G, DO   doxycycline (VIBRAMYCIN) 100 MG capsule Take 1 capsule (100 mg total) by mouth 2 (two) times daily. 14 capsule Thersa Salt G, DO     PDMP not reviewed this encounter.   Coral Spikes, [place] 10/04/20 1514

## 2020-10-04 NOTE — Discharge Instructions (Signed)
If you worsen, please go to the ER.  Antibiotics as prescribed.  Take care  Dr. Lacinda Axon

## 2020-10-30 ENCOUNTER — Encounter: Payer: Self-pay | Admitting: Internal Medicine

## 2020-10-30 ENCOUNTER — Ambulatory Visit (INDEPENDENT_AMBULATORY_CARE_PROVIDER_SITE_OTHER): Payer: BC Managed Care – PPO | Admitting: Internal Medicine

## 2020-10-30 ENCOUNTER — Other Ambulatory Visit: Payer: Self-pay

## 2020-10-30 VITALS — BP 132/70 | HR 80 | Temp 97.8°F | Ht 72.0 in | Wt 244.0 lb

## 2020-10-30 DIAGNOSIS — G609 Hereditary and idiopathic neuropathy, unspecified: Secondary | ICD-10-CM

## 2020-10-30 DIAGNOSIS — I10 Essential (primary) hypertension: Secondary | ICD-10-CM

## 2020-10-30 DIAGNOSIS — N1831 Chronic kidney disease, stage 3a: Secondary | ICD-10-CM

## 2020-10-30 DIAGNOSIS — R972 Elevated prostate specific antigen [PSA]: Secondary | ICD-10-CM | POA: Diagnosis not present

## 2020-10-30 DIAGNOSIS — I779 Disorder of arteries and arterioles, unspecified: Secondary | ICD-10-CM

## 2020-10-30 LAB — POCT URINALYSIS DIPSTICK
Bilirubin, UA: NEGATIVE
Blood, UA: NEGATIVE
Glucose, UA: NEGATIVE
Ketones, UA: NEGATIVE
Leukocytes, UA: NEGATIVE
Nitrite, UA: NEGATIVE
Protein, UA: NEGATIVE
Spec Grav, UA: 1.02 (ref 1.010–1.025)
Urobilinogen, UA: 0.2 E.U./dL
pH, UA: 6 (ref 5.0–8.0)

## 2020-10-30 NOTE — Progress Notes (Signed)
Date:  10/30/2020   Name:  Brian Payne   DOB:  1956/06/21   MRN:  [id number]   Chief Complaint: Hypertension  Hypertension This is a chronic problem. The problem is controlled. Pertinent negatives include no chest pain, headaches, palpitations or shortness of breath. Past treatments include angiotensin blockers and beta blockers. The current treatment provides significant improvement. There are no compliance problems.    Elevated PSA - on last exam was almost double the year before but still normal range.  Mild RI noted as well.  Flank pain - started several days ago.  Chiropractor thinks it is muscular but it is right over the right kidney.  No fever, chills, hematuria.  No hx of renal stones.  Lab Results  Component Value Date   CREATININE 1.48 (H) 06/05/2020   BUN 29 (H) 06/05/2020   NA 141 06/05/2020   K 4.9 06/05/2020   CL 105 06/05/2020   CO2 23 06/05/2020   Lab Results  Component Value Date   CHOL 155 06/05/2020   HDL 29 (L) 06/05/2020   LDLCALC 81 06/05/2020   TRIG 272 (H) 06/05/2020   CHOLHDL 5.3 (H) 06/05/2020   Lab Results  Component Value Date   TSH 1.110 01/17/2015   No results found for: HGBA1C Lab Results  Component Value Date   WBC 6.0 06/05/2020   HGB 13.4 06/05/2020   HCT 38.8 06/05/2020   MCV 94 06/05/2020   PLT 156 06/05/2020   Lab Results  Component Value Date   ALT 19 06/05/2020   AST 24 06/05/2020   ALKPHOS 67 06/05/2020   BILITOT 0.6 06/05/2020   Lab Results  Component Value Date   PSA1 2.5 06/05/2020   PSA1 1.6 06/01/2019   PSA1 1.7 05/25/2018   PSA 1.2 09/13/2013     Review of Systems  Constitutional: Negative for fatigue and unexpected weight change.  HENT: Negative for nosebleeds.   Eyes: Negative for visual disturbance.  Respiratory: Negative for cough, chest tightness, shortness of breath and wheezing.   Cardiovascular: Negative for chest pain, palpitations and leg swelling.  Gastrointestinal: Negative for  abdominal pain, constipation and diarrhea.  Genitourinary: Positive for flank pain. Negative for dysuria, hematuria and urgency.  Musculoskeletal: Positive for back pain (right flank pain). Negative for arthralgias.  Neurological: Negative for dizziness, weakness, light-headedness and headaches.    Patient Active Problem List   Diagnosis Date Noted  . Increased prostate specific antigen (PSA) velocity 06/06/2020  . CKD (chronic kidney disease), stage III ([place]) 05/25/2017  . Mild carotid artery disease ([place]) 02/25/2015  . Tinnitus of left ear 02/12/2015  . Cervical disc disease 11/20/2014  . Tubular adenoma of colon 11/20/2014  . 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 Dyslipidemia 11/20/2014  . ED (erectile dysfunction) of organic origin 11/20/2014  . Essential (primary) hypertension 11/20/2014  . Idiopathic peripheral neuropathy 11/20/2014  . Obstructive apnea 10/08/2011  . Coronary artery disease involving native coronary artery of native heart without angina pectoris 10/08/2011    Allergies  Allergen Reactions  . Crestor [Rosuvastatin] Other (See Comments)    myalgia  . Levofloxacin Other (See Comments)    dizziness  . Codeine Itching and Rash  . Nortriptyline Other (See Comments)    Urinary problems    Past Surgical History:  Procedure Laterality Date  . ANTERIOR FUSION CERVICAL SPINE  01/2016  . BILATERAL CARPAL TUNNEL RELEASE  09/2013  . COLONOSCOPY  08/14/2010   TA - rec 3  yr follow up Roxy Manns)  . CORONARY ANGIOPLASTY WITH STENT PLACEMENT  [id number]   [place]  . CORONARY ANGIOPLASTY WITH STENT PLACEMENT  06/05/2017   PCI of LCx with placement of 4.0 x 77mm DES  . DISTAL BICEPS TENDON REPAIR Right 03/30/2017   Procedure: DISTAL BICEPS TENDON REPAIR;  Surgeon: Earnestine Leys, MD;  Location: [facility] ORS;  Service: Orthopedics;  Laterality: Right;  . HYDROCELE EXCISION    . VASECTOMY      Social History   Tobacco Use  . Smoking status: Never Smoker  .  Smokeless tobacco: Never Used  Vaping Use  . Vaping Use: Never used  Substance Use Topics  . Alcohol use: No    Alcohol/week: 0.0 standard drinks  . Drug use: No     Medication list has been reviewed and updated.  Current Meds  Medication Sig  . allopurinol (ZYLOPRIM) 300 MG tablet Take 450 mg by mouth daily.  Marland Kitchen aspirin EC 81 MG tablet Take 81 mg by mouth daily.  Marland Kitchen b complex vitamins tablet Take 1 tablet by mouth daily.  Marland Kitchen gabapentin (NEURONTIN) 100 MG capsule Take 100 mg by mouth 2 (two) times daily.   Marland Kitchen GINKGO BILOBA PO Take 1 tablet by mouth daily.  Marland Kitchen GLUCOSAMINE HCL PO Take 1,500 mg by mouth daily.   Marland Kitchen HYDROcodone-acetaminophen (NORCO) 10-325 MG tablet Take 1 tablet by mouth 2 (two) times daily as needed.  Marland Kitchen losartan (COZAAR) 100 MG tablet Take 1 tablet (100 mg total) by mouth daily.  . meloxicam (MOBIC) 7.5 MG tablet Take 7.5-15 mg by mouth daily as needed.   . metoprolol succinate (TOPROL-XL) 25 MG 24 hr tablet Take 1 tablet (25 mg total) by mouth daily.  . Multiple Vitamin (MULTIVITAMIN WITH MINERALS) TABS tablet Take 1 tablet by mouth daily.  . nitroGLYCERIN (NITROSTAT) 0.4 MG SL tablet Place 1 tablet (0.4 mg total) under the tongue every 5 (five) minutes as needed for chest pain.  . NON FORMULARY CPAP @@ 10 cm H2O  . Omega-3 Fatty Acids (FISH OIL) 1200 MG CAPS Take 1,200 mg by mouth daily.   . Oxymetazoline HCl-Menthol (AFRIN MENTHOL SPRAY NA) Place into the nose at bedtime.  . pravastatin (PRAVACHOL) 20 MG tablet Take 1 tablet (20 mg total) by mouth daily.    PHQ 2/9 Scores 10/30/2020 06/05/2020 05/28/2020 06/01/2019  PHQ - 2 Score 0 0 0 0  PHQ- 9 Score 0 6 0 0    GAD 7 : Generalized Anxiety Score 10/30/2020 06/05/2020 05/28/2020  Nervous, Anxious, on Edge 0 1 0  Control/stop worrying 0 1 0  Worry too much - different things 0 1 0  Trouble relaxing 0 0 0  Restless 0 0 0  Easily annoyed or irritable 0 0 0  Afraid - awful might happen 0 0 0  Total GAD 7 Score 0 3 0   Anxiety Difficulty - Not difficult at all Not difficult at all    BP Readings from Last 3 Encounters:  10/30/20 132/70  10/04/20 131/71  06/05/20 122/74    Physical Exam Vitals and nursing note reviewed.  Constitutional:      General: He is not in acute distress.    Appearance: He is well-developed.  HENT:     Head: Normocephalic and atraumatic.  Cardiovascular:     Rate and Rhythm: Normal rate and regular rhythm.     Pulses: Normal pulses.     Heart sounds: No murmur heard.   Pulmonary:     Effort:  Pulmonary effort is normal. No respiratory distress.     Breath sounds: No wheezing or rhonchi.  Abdominal:     Tenderness: There is no right CVA tenderness or left CVA tenderness.  Musculoskeletal:     Right lower leg: No edema.     Left lower leg: No edema.  Skin:    General: Skin is warm and dry.     Capillary Refill: Capillary refill takes less than 2 seconds.     Findings: No rash.  Neurological:     General: No focal deficit present.     Mental Status: He is alert and oriented to person, place, and time.  Psychiatric:        Mood and Affect: Mood normal.        Behavior: Behavior normal.     Wt Readings from Last 3 Encounters:  10/30/20 244 lb (110.7 kg)  10/04/20 250 lb (113.4 kg)  06/05/20 248 lb (112.5 kg)    BP 132/70   Pulse 80   Temp 97.8 F (36.6 C) (Oral)   Ht 6' (1.829 m)   Wt 244 lb (110.7 kg)   SpO2 98%   BMI 33.09 kg/m   Assessment and Plan: 1. Essential (primary) hypertension Clinically stable exam with well controlled BP. Tolerating medications without side effects at this time. Pt to continue current regimen and low sodium diet; benefits of regular exercise as able discussed. - POCT urinalysis dipstick - negative so renal stone unlikely cause of flank pain  2. Increased prostate specific antigen (PSA) velocity Recheck today No prostate specific sx noted - PSA  3. Stage 3a chronic kidney disease ([place]) Monitoring due to high dose  allopurinol - Basic metabolic panel  4. Idiopathic peripheral neuropathy Begin followed by Neurology On Gabapentin Consider trial off of pravachol for hand cramping  5. Mild carotid artery disease ([place]) On pravachol   Partially dictated using Editor, commissioning. Any errors are unintentional.  Halina Maidens, MD [facility]  10/30/2020

## 2020-10-31 LAB — BASIC METABOLIC PANEL
BUN/Creatinine Ratio: 22 (ref 10–24)
BUN: 35 mg/dL — ABNORMAL HIGH (ref 8–27)
CO2: 22 mmol/L (ref 20–29)
Calcium: 10 mg/dL (ref 8.6–10.2)
Chloride: 103 mmol/L (ref 96–106)
Creatinine, Ser: 1.58 mg/dL — ABNORMAL HIGH (ref 0.76–1.27)
Glucose: 107 mg/dL — ABNORMAL HIGH (ref 65–99)
Potassium: 4.3 mmol/L (ref 3.5–5.2)
Sodium: 141 mmol/L (ref 134–144)
eGFR: 49 mL/min/{1.73_m2} — ABNORMAL LOW (ref >59)

## 2020-10-31 LAB — PSA: Prostate Specific Ag, Serum: 2.3 ng/mL (ref 0.0–4.0)

## 2020-11-10 ENCOUNTER — Ambulatory Visit (INDEPENDENT_AMBULATORY_CARE_PROVIDER_SITE_OTHER): Payer: Medicare Other

## 2020-11-10 ENCOUNTER — Ambulatory Visit
Admission: EM | Admit: 2020-11-10 | Discharge: 2020-11-10 | Disposition: A | Discharge status: Home / Self Care | Payer: Medicare Other | Attending: Family Medicine | Admitting: Family Medicine

## 2020-11-10 ENCOUNTER — Other Ambulatory Visit: Payer: Self-pay

## 2020-11-10 DIAGNOSIS — Z8739 Personal history of other diseases of the musculoskeletal system and connective tissue: Secondary | ICD-10-CM

## 2020-11-10 DIAGNOSIS — S90851A Superficial foreign body, right foot, initial encounter: Secondary | ICD-10-CM

## 2020-11-10 DIAGNOSIS — G629 Polyneuropathy, unspecified: Secondary | ICD-10-CM | POA: Diagnosis not present

## 2020-11-10 DIAGNOSIS — M79674 Pain in right toe(s): Secondary | ICD-10-CM

## 2020-11-10 NOTE — ED Triage Notes (Signed)
Pt c/o right 5th toe pain since last night. He thinks he may have hit it on something last night. Pt denies bruising or swelling, but states pain kept him awake last night. Pt does have neuropathy in both feet.

## 2020-11-10 NOTE — Discharge Instructions (Signed)
Increase your Gabapentin.  Call podiatry for an appt.  Take care  Dr. Lacinda Axon

## 2020-11-11 NOTE — ED Provider Notes (Signed)
[facility]    CSN: [id number] Arrival date & time: 11/10/20  1302  History   Chief Complaint Chief Complaint  Patient presents with  . Toe Pain    Right 5th   HPI  65 year old Payne presents with the above complaint.  Patient reports that he has had right fifth toe pain.  Started last night.  He is unsure whether he injured the area.  He does not recall a discrete injury.  He states that this is somewhat limited due to the fact that he has peripheral neuropathy and does not feel a great deal of his lower extremities.  He states that the pain is sharp and shooting.  It is intermittent.  Currently 5/10 in severity.  No relieving factors.  No other complaints.  Past Medical History:  Diagnosis Date  . Arthritis   . Chronic neck pain   . Coronary artery disease   . Difficult intubation   . Gout   . H/O heart artery stent   . History of hiatal hernia   . Hypertension   . Hypertension   . Nerve pain    idiopathic peripheral neuropathy  . Sleep apnea   . Sleep apnea with use of continuous positive airway pressure (CPAP)     Patient Active Problem List   Diagnosis Date Noted  . Increased prostate specific antigen (PSA) velocity 06/06/2020  . CKD (chronic kidney disease), stage III ([place]) 05/25/2017  . Mild carotid artery disease ([place]) 02/25/2015  . Tinnitus of left ear 02/12/2015  . Cervical disc disease 11/20/2014  . Tubular adenoma of colon 11/20/2014  . 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 Dyslipidemia 11/20/2014  . ED (erectile dysfunction) of organic origin 11/20/2014  . Essential (primary) hypertension 11/20/2014  . Idiopathic peripheral neuropathy 11/20/2014  . Obstructive apnea 10/08/2011  . Coronary artery disease involving native coronary artery of native heart without angina pectoris 10/08/2011    Past Surgical History:  Procedure Laterality Date  . ANTERIOR FUSION CERVICAL SPINE  01/2016  . BILATERAL CARPAL TUNNEL RELEASE   09/2013  . COLONOSCOPY  08/14/2010   TA - rec 3 yr follow up Roxy Manns)  . CORONARY ANGIOPLASTY WITH STENT PLACEMENT  [id number]   [place]  . CORONARY ANGIOPLASTY WITH STENT PLACEMENT  06/05/2017   PCI of LCx with placement of 4.0 x 83mm DES  . DISTAL BICEPS TENDON REPAIR Right 03/30/2017   Procedure: DISTAL BICEPS TENDON REPAIR;  Surgeon: Earnestine Leys, MD;  Location: [facility] ORS;  Service: Orthopedics;  Laterality: Right;  . HYDROCELE EXCISION    . VASECTOMY         Home Medications    Prior to Admission medications   Medication Sig Start Date End Date Taking? Authorizing Provider  allopurinol (ZYLOPRIM) 300 MG tablet Take 450 mg by mouth daily. 07/07/20  Yes [provider]  aspirin EC 81 MG tablet Take 81 mg by mouth daily.   Yes [provider]  b complex vitamins tablet Take 1 tablet by mouth daily.   Yes [provider]  gabapentin (NEURONTIN) 300 MG capsule Take 1 capsule by mouth 2 (two) times daily. 11/02/20  Yes [provider]  GINKGO BILOBA PO Take 1 tablet by mouth daily.   Yes [provider]  GLUCOSAMINE HCL PO Take 1,500 mg by mouth daily.  09/26/10  Yes [provider]  HYDROcodone-acetaminophen (NORCO) 10-325 MG tablet Take 1 tablet by mouth 2 (two) times daily as needed. 10/03/20  Yes  [provider]  losartan (COZAAR) 100 MG tablet Take 1 tablet (100 mg total) by mouth daily. 06/05/20  Yes Glean Hess, MD  meloxicam (MOBIC) 7.5 MG tablet Take 7.5-15 mg by mouth daily as needed.    Yes [provider]  metoprolol succinate (TOPROL-XL) 25 MG 24 hr tablet Take 1 tablet (25 mg total) by mouth daily. 06/05/20  Yes Glean Hess, MD  Multiple Vitamin (MULTIVITAMIN WITH MINERALS) TABS tablet Take 1 tablet by mouth daily.   Yes [provider]  nitroGLYCERIN (NITROSTAT) 0.4 MG SL tablet Place 1 tablet (0.4 mg total) under the tongue every 5 (five) minutes as needed for chest pain. 06/05/20  Yes Glean Hess, MD  NON FORMULARY CPAP @@ 10 cm H2O   Yes [provider]  Omega-3 Fatty Acids (FISH OIL) 1200 MG CAPS Take 1,200 mg by mouth daily.    Yes [provider]  Oxymetazoline HCl-Menthol (AFRIN MENTHOL SPRAY NA) Place into the nose at bedtime.   Yes [provider]  pravastatin (PRAVACHOL) 20 MG tablet Take 1 tablet (20 mg total) by mouth daily. 06/05/20  Yes Glean Hess, MD  clonazePAM (KLONOPIN) 0.5 MG tablet Take 0.25 mg by mouth 2 (two) times daily. Patient not taking: No sig reported 10/16/20   [provider]    Family History Family History  Problem Relation Age of Onset  . Rheum arthritis Mother   . Rheumatic fever Father     Social History Social History   Tobacco Use  . Smoking status: Never Smoker  . Smokeless tobacco: Never Used  Vaping Use  . Vaping Use: Never used  Substance Use Topics  . Alcohol use: No    Alcohol/week: 0.0 standard drinks  . Drug use: No     Allergies   Crestor [rosuvastatin], Levofloxacin, Codeine, and Nortriptyline   Review of Systems Review of Systems Per HPI  Physical Exam Triage Vital Signs ED Triage Vitals  Enc Vitals Group     BP 11/10/20 1322 118/69     Pulse Rate 11/10/20 1322 71     Resp 11/10/20 1322 18     Temp 11/10/20 1322 97.9 F (36.6 C)     Temp Source 11/10/20 1322 Oral     SpO2 11/10/20 1322 Brian %     Weight 11/10/20 1319 235 lb (106.6 kg)     Height 11/10/20 1319 6' (1.829 m)     Head Circumference --      Peak Flow --      Pain Score 11/10/20 1319 5     Pain Loc --      Pain Edu? --      Excl. in [place]? --    Updated Vital Signs BP 118/69 (BP Location: Left Arm)   Pulse 71   Temp 97.9 F (36.6 C) (Oral)   Resp 18   Ht 6' (1.829 m)   Wt 106.6 kg   SpO2 Brian%   BMI 31.87 kg/m   Visual Acuity Right Eye Distance:   Left Eye Distance:   Bilateral Distance:    Right Eye Near:   Left Eye Near:    Bilateral Near:     Physical Exam Vitals and nursing  note reviewed.  Constitutional:      General: He is not in acute distress.    Appearance: Normal appearance. He is not ill-appearing.  HENT:     Head: Normocephalic and atraumatic.  Eyes:     General:  Right eye: No discharge.        Left eye: No discharge.     Conjunctiva/sclera: Conjunctivae normal.  Pulmonary:     Effort: Pulmonary effort is normal. No respiratory distress.  Musculoskeletal:     Comments: Right fifth toe -no appreciable bruising.  Tender to palpation.  No open wound.  Neurological:     Mental Status: He is alert.  Psychiatric:        Mood and Affect: Mood normal.        Behavior: Behavior normal.    UC Treatments / Results  Labs (all labs ordered are listed, but only abnormal results are displayed) Labs Reviewed - No data to display  EKG   Radiology DG Toe 5th Right  Result Date: 11/10/2020 CLINICAL DATA:  Acute onset of pain involving the RIGHT fifth toe that began last night and capped the patient awake. Current history of peripheral neuropathy in gout. EXAM: RIGHT FIFTH TOE 3 VIEWS COMPARISON:  None. FINDINGS: No evidence of acute, subacute or healed fractures. Joint spaces well preserved patient age. No marginal erosions to confirm a diagnosis of gout. Note is made of a thin linear metallic foreign body (wire?) In the soft tissues of the dorsum of the fourth toe underlying the proximal phalanx. IMPRESSION: 1. No osseous abnormality. No evidence of gout. 2. Thin linear metallic foreign body (wire?) in the soft tissues of the dorsum of the fourth toe underlying the proximal phalanx. Electronically Signed   By: Evangeline Dakin M.D.   On: 11/10/2020 14:07    Procedures Procedures (including critical care time)  Medications Ordered in UC Medications - No data to display  Initial Impression / Assessment and Plan / UC Course  I have reviewed the triage vital signs and the nursing notes.  Pertinent labs & imaging results that were available during my  care of the patient were reviewed by me and considered in my medical decision making (see chart for details).    65 year old Payne presents with right 5th toe pain. Xray was obtained and independently reviewed by me. Interpretation: No apparent fracture or osseous abnormality of the right fifth toe.  There is an overlying metallic foreign body on the dorsum of the fourth toe.  There is no appreciable foreign body on exam.  I discussed this with the patient.  I informed him that I do not recommend blind incision and search for foreign body.  Advised to increase gabapentin to help with pain.  Advised to see podiatry.  Final Clinical Impressions(s) / UC Diagnoses   Final diagnoses:  Pain of toe of right foot  Foreign body in right foot, initial encounter     Discharge Instructions     Increase your Gabapentin.  Call podiatry for an appt.  Take care  Dr. Lacinda Axon    ED Prescriptions    None     PDMP not reviewed this encounter.   Coral Spikes, [place] 11/11/20 [phone]

## 2020-11-13 ENCOUNTER — Other Ambulatory Visit: Payer: Self-pay | Admitting: Podiatry

## 2020-11-13 DIAGNOSIS — G8929 Other chronic pain: Secondary | ICD-10-CM | POA: Diagnosis not present

## 2020-11-13 DIAGNOSIS — B351 Tinea unguium: Secondary | ICD-10-CM | POA: Diagnosis not present

## 2020-11-13 DIAGNOSIS — M545 Low back pain, unspecified: Secondary | ICD-10-CM | POA: Diagnosis not present

## 2020-11-13 DIAGNOSIS — S90851A Superficial foreign body, right foot, initial encounter: Secondary | ICD-10-CM | POA: Diagnosis not present

## 2020-11-13 DIAGNOSIS — M792 Neuralgia and neuritis, unspecified: Secondary | ICD-10-CM | POA: Diagnosis not present

## 2020-11-13 DIAGNOSIS — M79671 Pain in right foot: Secondary | ICD-10-CM | POA: Diagnosis not present

## 2020-11-13 DIAGNOSIS — G629 Polyneuropathy, unspecified: Secondary | ICD-10-CM | POA: Diagnosis not present

## 2020-11-15 ENCOUNTER — Ambulatory Visit: Payer: BC Managed Care – PPO | Admitting: Internal Medicine

## 2020-11-22 ENCOUNTER — Ambulatory Visit: Admit: 2020-11-22 | Payer: Medicare Other | Admitting: Podiatry

## 2020-11-22 SURGERY — REMOVAL FOREIGN BODY EXTREMITY
Anesthesia: Choice | Laterality: Right

## 2020-12-06 DIAGNOSIS — R200 Anesthesia of skin: Secondary | ICD-10-CM | POA: Diagnosis not present

## 2020-12-06 DIAGNOSIS — E538 Deficiency of other specified B group vitamins: Secondary | ICD-10-CM | POA: Diagnosis not present

## 2020-12-06 DIAGNOSIS — R202 Paresthesia of skin: Secondary | ICD-10-CM | POA: Diagnosis not present

## 2020-12-06 DIAGNOSIS — E559 Vitamin D deficiency, unspecified: Secondary | ICD-10-CM | POA: Diagnosis not present

## 2020-12-06 DIAGNOSIS — G2581 Restless legs syndrome: Secondary | ICD-10-CM | POA: Diagnosis not present

## 2020-12-06 DIAGNOSIS — G629 Polyneuropathy, unspecified: Secondary | ICD-10-CM | POA: Diagnosis not present

## 2021-01-23 ENCOUNTER — Ambulatory Visit (INDEPENDENT_AMBULATORY_CARE_PROVIDER_SITE_OTHER): Payer: Medicare Other | Admitting: Internal Medicine

## 2021-01-23 DIAGNOSIS — Z79891 Long term (current) use of opiate analgesic: Secondary | ICD-10-CM | POA: Diagnosis not present

## 2021-01-23 DIAGNOSIS — M792 Neuralgia and neuritis, unspecified: Secondary | ICD-10-CM | POA: Diagnosis not present

## 2021-01-23 DIAGNOSIS — M25519 Pain in unspecified shoulder: Secondary | ICD-10-CM | POA: Diagnosis not present

## 2021-01-23 DIAGNOSIS — G4733 Obstructive sleep apnea (adult) (pediatric): Secondary | ICD-10-CM | POA: Diagnosis not present

## 2021-01-23 DIAGNOSIS — G5600 Carpal tunnel syndrome, unspecified upper limb: Secondary | ICD-10-CM | POA: Diagnosis not present

## 2021-01-23 DIAGNOSIS — I10 Essential (primary) hypertension: Secondary | ICD-10-CM

## 2021-01-23 DIAGNOSIS — G894 Chronic pain syndrome: Secondary | ICD-10-CM | POA: Diagnosis not present

## 2021-01-23 DIAGNOSIS — Z7189 Other specified counseling: Secondary | ICD-10-CM

## 2021-01-23 DIAGNOSIS — R52 Pain, unspecified: Secondary | ICD-10-CM | POA: Diagnosis not present

## 2021-01-23 NOTE — Progress Notes (Signed)
[facility] [place], [place] [address]  Pulmonary Sleep Medicine   Office Visit Note  Patient Name: TAURUS ALAMO DOB: 1956/05/08 MRN [id number]  I connected with  Marny Lowenstein Almendariz on 01/23/21 by a video enabled telemedicine application and verified that I am speaking with the correct person using two identifiers.   I discussed the limitations of evaluation and management by telemedicine. The patient expressed understanding and agreed to proceed.   Chief Complaint: Obstructive Sleep Apnea visit  Brief History:  Ahmaad is seen today for Initial consult for new machine on CPAP@10cmH20 . The patient has a long history of sleep apnea. Patient is using PAP nightly.  The patient feels better after sleeping with PAP.  The patient reports benefit from PAP use. Reported sleepiness is improved and the Epworth Sleepiness Score is 10 out of 24. The patient occasionally takes naps for up to an hour in the afternoon. The patient complains of the following: Water chamber keeps leaking and keeps needing to be replaced frequently. Pt has actually stopped using current machine because of problems with it and is using old machine that has motor life warning. Machine needs to be replaced. The compliance download shows  compliance with an average use time of 6.5 hours. The AHI is 0.5  The patient does not complain of limb movements disrupting sleep.  ROS  General: (-) fever, (-) chills, (-) night sweat Nose and Sinuses: (-) nasal stuffiness or itchiness, (-) postnasal drip, (-) nosebleeds, (-) sinus trouble. Mouth and Throat: (-) sore throat, (-) hoarseness. Neck: (-) swollen glands, (-) enlarged thyroid, (-) neck pain. Respiratory: - cough, - shortness of breath, - wheezing. Neurologic: + numbness, + tingling. Psychiatric: - anxiety, - depression   Current Medication: Outpatient Encounter Medications as of 01/23/2021  Medication Sig Note   allopurinol (ZYLOPRIM) 300 MG  tablet Take 450 mg by mouth daily.    aspirin EC 81 MG tablet Take 81 mg by mouth daily.    b complex vitamins tablet Take 1 tablet by mouth daily.    gabapentin (NEURONTIN) 300 MG capsule Take 1 capsule by mouth 2 (two) times daily.    GINKGO BILOBA PO Take 1 tablet by mouth daily.    GLUCOSAMINE HCL PO Take 1,500 mg by mouth daily.     HYDROcodone-acetaminophen (NORCO) 10-325 MG tablet Take 1 tablet by mouth 2 (two) times daily as needed.    losartan (COZAAR) 100 MG tablet Take 1 tablet (100 mg total) by mouth daily.    meloxicam (MOBIC) 7.5 MG tablet Take 7.5-15 mg by mouth daily as needed.     metoprolol succinate (TOPROL-XL) 25 MG 24 hr tablet Take 1 tablet (25 mg total) by mouth daily.    Multiple Vitamin (MULTIVITAMIN WITH MINERALS) TABS tablet Take 1 tablet by mouth daily.    nitroGLYCERIN (NITROSTAT) 0.4 MG SL tablet Place 1 tablet (0.4 mg total) under the tongue every 5 (five) minutes as needed for chest pain.    NON FORMULARY CPAP @@ 10 cm H2O    Omega-3 Fatty Acids (FISH OIL) 1200 MG CAPS Take 1,200 mg by mouth daily.     Oxymetazoline HCl-Menthol (AFRIN MENTHOL SPRAY NA) Place into the nose at bedtime.    pravastatin (PRAVACHOL) 20 MG tablet Take 1 tablet (20 mg total) by mouth daily.    [DISCONTINUED] clonazePAM (KLONOPIN) 0.5 MG tablet Take 0.25 mg by mouth 2 (two) times daily. (Patient not taking: No sig reported) 10/30/2020: Dr. Melrose Nakayama   No  facility-administered encounter medications on file as of 01/23/2021.    Surgical History: Past Surgical History:  Procedure Laterality Date   ANTERIOR FUSION CERVICAL SPINE  01/2016   BILATERAL CARPAL TUNNEL RELEASE  09/2013   COLONOSCOPY  08/14/2010   TA - rec 3 yr follow up ([facility])   CORONARY ANGIOPLASTY WITH STENT PLACEMENT  [id number]   [facility]   CORONARY ANGIOPLASTY WITH STENT PLACEMENT  06/05/2017   PCI of LCx with placement of 4.0 x 90m DES   DISTAL BICEPS TENDON REPAIR Right 03/30/2017   Procedure: DISTAL BICEPS TENDON REPAIR;   Surgeon: MEarnestine Leys MD;  Location: [facility] ORS;  Service: Orthopedics;  Laterality: Right;   HYDROCELE EXCISION     VASECTOMY      Medical History: Past Medical History:  Diagnosis Date   Arthritis    Chronic neck pain    Coronary artery disease    Difficult intubation    Gout    H/O heart artery stent    History of hiatal hernia    Hypertension    Hypertension    Nerve pain    idiopathic peripheral neuropathy   Sleep apnea    Sleep apnea with use of continuous positive airway pressure (CPAP)     Family History: Non contributory to the present illness  Social History: Social History   Socioeconomic History   Marital status: Married    Spouse name: Not on file   Number of children: Not on file   Years of education: Not on file   Highest education level: Not on file  Occupational History   Not on file  Tobacco Use   Smoking status: Never   Smokeless tobacco: Never  Vaping Use   Vaping Use: Never used  Substance and Sexual Activity   Alcohol use: No    Alcohol/week: 0.0 standard drinks   Drug use: No   Sexual activity: Not on file  Other Topics Concern   Not on file  Social History Narrative   Not on file   Social Determinants of Health   Financial Resource Strain: Not on file  Food Insecurity: Not on file  Transportation Needs: Not on file  Physical Activity: Not on file  Stress: Not on file  Social Connections: Not on file  Intimate Partner Violence: Not on file    Vital Signs: There were no vitals taken for this visit.  Examination: General Appearance: The patient is well-developed, well-nourished, and in no distress. Neck Circumference:  Skin: Gross inspection of skin unremarkable. Head: normocephalic, no gross deformities. Eyes: no gross deformities noted. ENT: ears appear grossly normal Neurologic: Alert and oriented. No involuntary movements.    EPWORTH SLEEPINESS SCALE:  Scale:  (0)= no chance of dozing; (1)= slight chance of  dozing; (2)= moderate chance of dozing; (3)= high chance of dozing  Chance  Situtation    Sitting and reading: 3    Watching TV: 1    Sitting Inactive in public: 1    As a passenger in car: 1      Lying down to rest: 2    Sitting and talking: 1    Sitting quielty after lunch: 1    In a car, stopped in traffic: 0   TOTAL SCORE:   10 out of 24    SLEEP STUDIES:  Split 01/31/11   CPAP COMPLIANCE DATA:  Date Range: 12/22/2020-56/19/22  Average Daily Use: 6.4 hours  Median Use: 7.4  Compliance for > 4 Hours: 70%  AHI: 0.5 respiratory  events per hour  Days Used: 25/30  Mask Leak: 66  95th Percentile Pressure: 10         LABS: Recent Results (from the past 2160 hour(s))  POCT urinalysis dipstick     Status: Normal   Collection Time: 10/30/20  8:42 AM  Result Value Ref Range   Color, UA [place]    Clarity, UA clear    Glucose, UA Negative Negative   Bilirubin, UA neg    Ketones, UA neg    Spec Grav, UA 1.020 1.010 - 1.025   Blood, UA neg    pH, UA 6.0 5.0 - 8.0   Protein, UA Negative Negative   Urobilinogen, UA 0.2 0.2 or 1.0 E.U./dL   Nitrite, UA neg    Leukocytes, UA Negative Negative   Appearance [place],clear    Odor none   Basic metabolic panel     Status: Abnormal   Collection Time: 10/30/20  8:51 AM  Result Value Ref Range   Glucose 107 (H) 65 - 99 mg/dL   BUN 35 (H) 8 - 27 mg/dL   Creatinine, Ser 1.58 (H) 0.76 - 1.27 mg/dL   eGFR 49 (L) >59 mL/min/1.73   BUN/Creatinine Ratio 22 10 - 24   Sodium 141 134 - 144 mmol/L   Potassium 4.3 3.5 - 5.2 mmol/L   Chloride 103 96 - 106 mmol/L   CO2 22 20 - 29 mmol/L   Calcium 10.0 8.6 - 10.2 mg/dL  PSA     Status: None   Collection Time: 10/30/20  8:51 AM  Result Value Ref Range   Prostate Specific Ag, Serum 2.3 0.0 - 4.0 ng/mL    Comment: Roche ECLIA methodology. According to the American Urological Association, Serum PSA should decrease and remain at undetectable levels after  radical prostatectomy. The AUA defines biochemical recurrence as an initial PSA value 0.2 ng/mL or greater followed by a subsequent confirmatory PSA value 0.2 ng/mL or greater. Values obtained with different assay methods or kits cannot be used interchangeably. Results cannot be interpreted as absolute evidence of the presence or absence of malignant disease.     Radiology: DG Toe 5th Right  Result Date: 11/10/2020 CLINICAL DATA:  Acute onset of pain involving the RIGHT fifth toe that began last night and capped the patient awake. Current history of peripheral neuropathy in gout. EXAM: RIGHT FIFTH TOE 3 VIEWS COMPARISON:  None. FINDINGS: No evidence of acute, subacute or healed fractures. Joint spaces well preserved patient age. No marginal erosions to confirm a diagnosis of gout. Note is made of a thin linear metallic foreign body (wire?) In the soft tissues of the dorsum of the fourth toe underlying the proximal phalanx. IMPRESSION: 1. No osseous abnormality. No evidence of gout. 2. Thin linear metallic foreign body (wire?) in the soft tissues of the dorsum of the fourth toe underlying the proximal phalanx. Electronically Signed   By: Evangeline Dakin M.D.   On: 11/10/2020 14:07    No results found.  No results found.    Assessment and Plan: Patient Active Problem List   Diagnosis Date Noted   Increased prostate specific antigen (PSA) velocity 06/06/2020   CKD (chronic kidney disease), stage III ([place]) 05/25/2017   Mild carotid artery disease ([place]) 02/25/2015   Tinnitus of left ear 02/12/2015   Cervical disc disease 11/20/2014   Tubular adenoma of colon 11/20/2014   Idiopathic chronic gout of foot without tophus 11/20/2014   Dyslipidemia 11/20/2014   ED (erectile dysfunction) of organic origin  11/20/2014   Essential (primary) hypertension 11/20/2014   Idiopathic peripheral neuropathy 11/20/2014   Obstructive apnea 10/08/2011   Coronary artery disease involving native coronary  artery of native heart without angina pectoris 10/08/2011      The patient does tolerate PAP and reports benefit from PAP use. The patient was reminded how to adjust humidity and advised to change supplies regularly. The patient was also counselled on nightly use. The compliance is fair. The AHI is 0.5. The patient's machine is past end of life and must be replaced.   1. OSA (obstructive sleep apnea) continue excellent compliance. Follow up 30+ days after set up   2. CPAP use counseling CPAP couseling-Discussed importance of adequate CPAP use as well as proper care and cleaning techniques of machine and all supplies.  3. Essential (primary) hypertension Continue current medication and f/u with PCP.    General Counseling: I have discussed the findings of the evaluation and examination with Juanda Crumble.  I have also discussed any further diagnostic evaluation thatmay be needed or ordered today. Gor verbalizes understanding of the findings of todays visit. We also reviewed his medications today and discussed drug interactions and side effects including but not limited excessive drowsiness and altered mental states. We also discussed that there is always a risk not just to him but also people around him. he has been encouraged to call the office with any questions or concerns that should arise related to todays visit.  No orders of the defined types were placed in this encounter.       I have personally obtained a history, examined the patient, evaluated laboratory and imaging results, formulated the assessment and plan and placed orders.  This patient was seen by Drema Dallas, PA-C in collaboration with Dr. Devona Konig as a part of collaborative care agreement.   Richelle Ito Saunders Glance, PhD, FAASM  Diplomate, American Board of Sleep Medicine    Allyne Gee, MD [facility] Diplomate ABMS Pulmonary and Critical Care Medicine Sleep medicine

## 2021-01-23 NOTE — Patient Instructions (Signed)

## 2021-03-06 DIAGNOSIS — G4733 Obstructive sleep apnea (adult) (pediatric): Secondary | ICD-10-CM | POA: Diagnosis not present

## 2021-03-23 ENCOUNTER — Other Ambulatory Visit: Payer: Self-pay | Admitting: Internal Medicine

## 2021-03-23 DIAGNOSIS — E785 Hyperlipidemia, unspecified: Secondary | ICD-10-CM

## 2021-03-23 NOTE — Telephone Encounter (Signed)
last RF 06/05/20 #90 3 RF

## 2021-04-22 DIAGNOSIS — S90851A Superficial foreign body, right foot, initial encounter: Secondary | ICD-10-CM | POA: Diagnosis not present

## 2021-04-22 DIAGNOSIS — G629 Polyneuropathy, unspecified: Secondary | ICD-10-CM | POA: Diagnosis not present

## 2021-04-22 DIAGNOSIS — L97512 Non-pressure chronic ulcer of other part of right foot with fat layer exposed: Secondary | ICD-10-CM | POA: Diagnosis not present

## 2021-04-23 DIAGNOSIS — L97519 Non-pressure chronic ulcer of other part of right foot with unspecified severity: Secondary | ICD-10-CM | POA: Diagnosis not present

## 2021-04-24 DIAGNOSIS — G5600 Carpal tunnel syndrome, unspecified upper limb: Secondary | ICD-10-CM | POA: Diagnosis not present

## 2021-04-24 DIAGNOSIS — Z79891 Long term (current) use of opiate analgesic: Secondary | ICD-10-CM | POA: Diagnosis not present

## 2021-04-24 DIAGNOSIS — G894 Chronic pain syndrome: Secondary | ICD-10-CM | POA: Diagnosis not present

## 2021-04-24 DIAGNOSIS — M25519 Pain in unspecified shoulder: Secondary | ICD-10-CM | POA: Diagnosis not present

## 2021-04-24 DIAGNOSIS — M792 Neuralgia and neuritis, unspecified: Secondary | ICD-10-CM | POA: Diagnosis not present

## 2021-04-24 DIAGNOSIS — R52 Pain, unspecified: Secondary | ICD-10-CM | POA: Diagnosis not present

## 2021-05-06 DIAGNOSIS — L97512 Non-pressure chronic ulcer of other part of right foot with fat layer exposed: Secondary | ICD-10-CM | POA: Diagnosis not present

## 2021-05-06 DIAGNOSIS — G629 Polyneuropathy, unspecified: Secondary | ICD-10-CM | POA: Diagnosis not present

## 2021-05-20 ENCOUNTER — Other Ambulatory Visit: Payer: Self-pay | Admitting: Internal Medicine

## 2021-05-20 DIAGNOSIS — L97519 Non-pressure chronic ulcer of other part of right foot with unspecified severity: Secondary | ICD-10-CM | POA: Diagnosis not present

## 2021-05-20 DIAGNOSIS — I10 Essential (primary) hypertension: Secondary | ICD-10-CM

## 2021-05-21 DIAGNOSIS — L97519 Non-pressure chronic ulcer of other part of right foot with unspecified severity: Secondary | ICD-10-CM | POA: Diagnosis not present

## 2021-05-27 DIAGNOSIS — L97512 Non-pressure chronic ulcer of other part of right foot with fat layer exposed: Secondary | ICD-10-CM | POA: Diagnosis not present

## 2021-05-29 ENCOUNTER — Other Ambulatory Visit: Payer: Self-pay | Admitting: Internal Medicine

## 2021-05-29 DIAGNOSIS — E785 Hyperlipidemia, unspecified: Secondary | ICD-10-CM

## 2021-05-29 NOTE — Telephone Encounter (Signed)
Requested Prescriptions  Pending Prescriptions Disp Refills  . pravastatin (PRAVACHOL) 20 MG tablet [Pharmacy Med Name: PRAVASTATIN SODIUM 20 MG TAB] 90 tablet 0    Sig: TAKE 1 TABLET BY MOUTH EVERY DAY     Cardiovascular:  Antilipid - Statins Failed - 05/29/2021  1:24 AM      Failed - HDL in normal range and within 360 days    HDL  Date Value Ref Range Status  06/05/2020 29 (L) >39 mg/dL Final         Failed - Triglycerides in normal range and within 360 days    Triglycerides  Date Value Ref Range Status  06/05/2020 272 (H) 0 - 149 mg/dL Final         Passed - Total Cholesterol in normal range and within 360 days    Cholesterol, Total  Date Value Ref Range Status  06/05/2020 155 100 - 199 mg/dL Final         Passed - LDL in normal range and within 360 days    LDL Chol Calc (NIH)  Date Value Ref Range Status  06/05/2020 81 0 - 99 mg/dL Final         Passed - Patient is not pregnant      Passed - Valid encounter within last 12 months    Recent Outpatient Visits          7 months ago Essential (primary) hypertension   [facility] Glean Hess, MD   11 months ago Annual physical exam   [facility] Glean Hess, MD   1 year ago Left lower quadrant abdominal tenderness without rebound tenderness   [facility] Glean Hess, MD   1 year ago Annual physical exam   [facility] Glean Hess, MD   2 years ago Foraminal stenosis of cervical region   [facility] Glean Hess, MD      Future Appointments            In 1 week Army Melia Jesse Sans, MD [facility], [facility]

## 2021-06-06 DIAGNOSIS — G4733 Obstructive sleep apnea (adult) (pediatric): Secondary | ICD-10-CM | POA: Diagnosis not present

## 2021-06-10 ENCOUNTER — Ambulatory Visit (INDEPENDENT_AMBULATORY_CARE_PROVIDER_SITE_OTHER): Payer: Medicare Other | Admitting: Internal Medicine

## 2021-06-10 ENCOUNTER — Other Ambulatory Visit: Payer: Self-pay

## 2021-06-10 ENCOUNTER — Encounter: Payer: Self-pay | Admitting: Internal Medicine

## 2021-06-10 VITALS — BP 122/68 | HR 76 | Temp 98.0°F | Ht 72.0 in | Wt 233.8 lb

## 2021-06-10 DIAGNOSIS — Z23 Encounter for immunization: Secondary | ICD-10-CM | POA: Diagnosis not present

## 2021-06-10 DIAGNOSIS — Z1211 Encounter for screening for malignant neoplasm of colon: Secondary | ICD-10-CM | POA: Diagnosis not present

## 2021-06-10 DIAGNOSIS — N1831 Chronic kidney disease, stage 3a: Secondary | ICD-10-CM | POA: Diagnosis not present

## 2021-06-10 DIAGNOSIS — M1A0790 Idiopathic chronic gout, unspecified ankle and foot, without tophus (tophi): Secondary | ICD-10-CM

## 2021-06-10 DIAGNOSIS — I10 Essential (primary) hypertension: Secondary | ICD-10-CM

## 2021-06-10 DIAGNOSIS — Z0000 Encounter for general adult medical examination without abnormal findings: Secondary | ICD-10-CM

## 2021-06-10 DIAGNOSIS — Z125 Encounter for screening for malignant neoplasm of prostate: Secondary | ICD-10-CM | POA: Diagnosis not present

## 2021-06-10 DIAGNOSIS — E785 Hyperlipidemia, unspecified: Secondary | ICD-10-CM | POA: Diagnosis not present

## 2021-06-10 DIAGNOSIS — J0100 Acute maxillary sinusitis, unspecified: Secondary | ICD-10-CM | POA: Diagnosis not present

## 2021-06-10 LAB — POCT URINALYSIS DIPSTICK
Bilirubin, UA: NEGATIVE
Blood, UA: NEGATIVE
Glucose, UA: NEGATIVE
Ketones, UA: NEGATIVE
Leukocytes, UA: NEGATIVE
Nitrite, UA: NEGATIVE
Protein, UA: NEGATIVE
Spec Grav, UA: 1.015 (ref 1.010–1.025)
Urobilinogen, UA: 0.2 E.U./dL
pH, UA: 6 (ref 5.0–8.0)

## 2021-06-10 MED ORDER — METOPROLOL SUCCINATE ER 25 MG PO TB24: 25.0000 mg | ORAL_TABLET | Freq: Every day | ORAL | 1 refills | Status: DC

## 2021-06-10 MED ORDER — ALLOPURINOL 300 MG PO TABS: 450.0000 mg | ORAL_TABLET | Freq: Every day | ORAL | 1 refills | Status: DC

## 2021-06-10 MED ORDER — AZITHROMYCIN 250 MG PO TABS: ORAL_TABLET | ORAL | 0 refills | Status: AC

## 2021-06-10 MED ORDER — PRAVASTATIN SODIUM 20 MG PO TABS: 20.0000 mg | ORAL_TABLET | Freq: Every day | ORAL | 1 refills | Status: DC

## 2021-06-10 MED ORDER — LOSARTAN POTASSIUM 100 MG PO TABS: 100.0000 mg | ORAL_TABLET | Freq: Every day | ORAL | 3 refills | Status: AC

## 2021-06-10 NOTE — Progress Notes (Signed)
Date:  06/10/2021   Name:  Brian Payne   DOB:  Mar 08, 1956   MRN:  [id number]   Chief Complaint: Annual Exam (UA.) and Sinusitis Brian Payne is a 65 y.o. male who presents today for his Complete Annual Exam. He feels poorly. He reports not exercising at this time. He reports he is sleeping poorly.   Colonoscopy: 1/20212  Immunization History  Administered Date(s) Administered  . Influenza,inj,Quad PF,6+ Mos 04/25/2015, 05/20/2016, 05/22/2017, 05/25/2018, 06/01/2019, 06/05/2020  . Moderna Sars-Covid-2 Vaccination 10/26/2019, 11/29/2019, 02/02/2020, 03/04/2020, 08/15/2020  . Tdap 01/01/2014, 08/28/2018  . Zoster, Live 09/04/2011    Hypertension This is a chronic problem. The problem is controlled. Pertinent negatives include no chest pain, headaches, palpitations or shortness of breath. Past treatments include beta blockers and angiotensin blockers. The current treatment provides significant improvement. Hypertensive end-organ damage includes kidney disease and CAD/MI.  Hyperlipidemia This is a chronic problem. The problem is controlled. Pertinent negatives include no chest pain, myalgias or shortness of breath. Current antihyperlipidemic treatment includes statins. The current treatment provides significant improvement of lipids.  Sinusitis The current episode started in the past 7 days. The problem is unchanged. There has been no fever. Associated symptoms include chills, congestion, coughing, diaphoresis and sinus pressure. Pertinent negatives include no headaches, shortness of breath or sore throat. Has not been tested for the Flu or Covid.  Toe ulcer - being followed and treated by podiatry.  It is very slow to heal.  He has difficultly keeping the pressure off when walking.  Lab Results  Component Value Date   CREATININE 1.58 (H) 10/30/2020   BUN 35 (H) 10/30/2020   NA 141 10/30/2020   K 4.3 10/30/2020   CL 103 10/30/2020   CO2 22 10/30/2020   Lab Results  Component  Value Date   CHOL 155 06/05/2020   HDL 29 (L) 06/05/2020   LDLCALC 81 06/05/2020   TRIG 272 (H) 06/05/2020   CHOLHDL 5.3 (H) 06/05/2020   Lab Results  Component Value Date   TSH 1.110 01/17/2015   No results found for: HGBA1C Lab Results  Component Value Date   WBC 6.0 06/05/2020   HGB 13.4 06/05/2020   HCT 38.8 06/05/2020   MCV 94 06/05/2020   PLT 156 06/05/2020   Lab Results  Component Value Date   ALT 19 06/05/2020   AST 24 06/05/2020   ALKPHOS 67 06/05/2020   BILITOT 0.6 06/05/2020   Lab Results  Component Value Date   PSA1 2.3 10/30/2020   PSA1 2.5 06/05/2020   PSA1 1.6 06/01/2019   PSA 1.2 09/13/2013   Lab Results  Component Value Date   LABURIC 4.6 06/05/2020     Review of Systems  Constitutional:  Positive for chills and diaphoresis. Negative for appetite change, fatigue and unexpected weight change.  HENT:  Positive for congestion and sinus pressure. Negative for hearing loss, postnasal drip, sore throat, tinnitus, trouble swallowing and voice change.   Eyes:  Negative for visual disturbance.  Respiratory:  Positive for cough. Negative for choking, shortness of breath and wheezing.   Cardiovascular:  Negative for chest pain, palpitations and leg swelling.  Gastrointestinal:  Negative for abdominal pain, blood in stool, constipation and diarrhea.  Genitourinary:  Negative for difficulty urinating, dysuria and frequency.  Musculoskeletal:  Positive for arthralgias. Negative for back pain and myalgias.  Skin:  Positive for wound (right great toe). Negative for color change and rash.  Neurological:  Negative for dizziness, syncope and headaches.  Hematological:  Negative for adenopathy.  Psychiatric/Behavioral:  Negative for dysphoric mood and sleep disturbance. The patient is not nervous/anxious.    Patient Active Problem List   Diagnosis Date Noted  . Increased prostate specific antigen (PSA) velocity 06/06/2020  . CKD (chronic kidney disease), stage  III ([place]) 05/25/2017  . Mild carotid artery disease ([place]) 02/25/2015  . Tinnitus of left ear 02/12/2015  . Cervical disc disease 11/20/2014  . Tubular adenoma of colon 11/20/2014  . 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 Dyslipidemia 11/20/2014  . ED (erectile dysfunction) of organic origin 11/20/2014  . Essential (primary) hypertension 11/20/2014  . Idiopathic peripheral neuropathy 11/20/2014  . Obstructive apnea 10/08/2011  . Coronary artery disease involving native coronary artery of native heart without angina pectoris 10/08/2011    Allergies  Allergen Reactions  . Crestor [Rosuvastatin] Other (See Comments)    myalgia  . Levofloxacin Other (See Comments)    dizziness  . Codeine Itching and Rash  . Nortriptyline Other (See Comments)    Urinary problems    Past Surgical History:  Procedure Laterality Date  . ANTERIOR FUSION CERVICAL SPINE  01/2016  . BILATERAL CARPAL TUNNEL RELEASE  09/2013  . COLONOSCOPY  08/14/2010   TA - rec 3 yr follow up Roxy Manns)  . CORONARY ANGIOPLASTY WITH STENT PLACEMENT  [id number]   [place]  . CORONARY ANGIOPLASTY WITH STENT PLACEMENT  06/05/2017   PCI of LCx with placement of 4.0 x 94mm DES  . DISTAL BICEPS TENDON REPAIR Right 03/30/2017   Procedure: DISTAL BICEPS TENDON REPAIR;  Surgeon: Earnestine Leys, MD;  Location: [facility] ORS;  Service: Orthopedics;  Laterality: Right;  . HYDROCELE EXCISION    . VASECTOMY      Social History   Tobacco Use  . Smoking status: Never  . Smokeless tobacco: Never  Vaping Use  . Vaping Use: Never used  Substance Use Topics  . Alcohol use: No    Alcohol/week: 0.0 standard drinks  . Drug use: No     Medication list has been reviewed and updated.  Current Meds  Medication Sig  . allopurinol (ZYLOPRIM) 300 MG tablet Take 450 mg by mouth daily.  Marland Kitchen aspirin EC 81 MG tablet Take 81 mg by mouth daily.  Marland Kitchen b complex vitamins tablet Take 1 tablet by mouth daily.  Marland Kitchen gabapentin (NEURONTIN) 300 MG  capsule Take 1 capsule by mouth 2 (two) times daily.  Marland Kitchen GINKGO BILOBA PO Take 1 tablet by mouth daily.  Marland Kitchen GLUCOSAMINE HCL PO Take 1,500 mg by mouth daily.   Marland Kitchen HYDROcodone-acetaminophen (NORCO) 10-325 MG tablet Take 1 tablet by mouth 2 (two) times daily as needed.  Marland Kitchen losartan (COZAAR) 100 MG tablet Take 1 tablet (100 mg total) by mouth daily.  . meloxicam (MOBIC) 7.5 MG tablet Take 7.5-15 mg by mouth daily as needed.   . metoprolol succinate (TOPROL-XL) 25 MG 24 hr tablet TAKE 1 TABLET BY MOUTH EVERY DAY  . Multiple Vitamin (MULTIVITAMIN WITH MINERALS) TABS tablet Take 1 tablet by mouth daily.  . nitroGLYCERIN (NITROSTAT) 0.4 MG SL tablet Place 1 tablet (0.4 mg total) under the tongue every 5 (five) minutes as needed for chest pain.  . NON FORMULARY CPAP @@ 10 cm H2O  . Omega-3 Fatty Acids (FISH OIL) 1200 MG CAPS Take 1,200 mg by mouth daily.   . Oxymetazoline HCl-Menthol (AFRIN MENTHOL SPRAY NA) Place into the nose at bedtime.  . pravastatin (PRAVACHOL) 20 MG tablet TAKE 1 TABLET BY MOUTH  EVERY DAY    PHQ 2/9 Scores 10/30/2020 06/05/2020 05/28/2020 06/01/2019  PHQ - 2 Score 0 0 0 0  PHQ- 9 Score 0 6 0 0    GAD 7 : Generalized Anxiety Score 10/30/2020 06/05/2020 05/28/2020  Nervous, Anxious, on Edge 0 1 0  Control/stop worrying 0 1 0  Worry too much - different things 0 1 0  Trouble relaxing 0 0 0  Restless 0 0 0  Easily annoyed or irritable 0 0 0  Afraid - awful might happen 0 0 0  Total GAD 7 Score 0 3 0  Anxiety Difficulty - Not difficult at all Not difficult at all    BP Readings from Last 3 Encounters:  06/10/21 122/68  11/10/20 118/69  10/30/20 132/70    Physical Exam Vitals and nursing note reviewed.  Constitutional:      Appearance: Normal appearance. He is well-developed.  HENT:     Head: Normocephalic.     Right Ear: Tympanic membrane, ear canal and external ear normal.     Left Ear: Tympanic membrane, ear canal and external ear normal.     Nose:     Right Sinus:  Maxillary sinus tenderness present.     Left Sinus: Maxillary sinus tenderness present.  Eyes:     Conjunctiva/sclera: Conjunctivae normal.     Pupils: Pupils are equal, round, and reactive to light.  Neck:     Thyroid: No thyromegaly.     Vascular: No carotid bruit.  Cardiovascular:     Rate and Rhythm: Normal rate and regular rhythm.     Heart sounds: Normal heart sounds.  Pulmonary:     Effort: Pulmonary effort is normal.     Breath sounds: Normal breath sounds and air entry. No wheezing.  Chest:  Breasts:    Right: No mass.     Left: No mass.  Abdominal:     General: Bowel sounds are normal.     Palpations: Abdomen is soft.     Tenderness: There is no abdominal tenderness.  Musculoskeletal:        General: Normal range of motion.     Cervical back: Normal range of motion and neck supple.  Feet:     Comments: Dressing intact to right great toe. Lymphadenopathy:     Cervical: No cervical adenopathy.  Skin:    General: Skin is warm and dry.  Neurological:     Mental Status: He is alert and oriented to person, place, and time.     Deep Tendon Reflexes: Reflexes are normal and symmetric.  Psychiatric:        Attention and Perception: Attention normal.        Mood and Affect: Mood normal.        Thought Content: Thought content normal.    Wt Readings from Last 3 Encounters:  06/10/21 233 lb 12.8 oz (106.1 kg)  11/10/20 235 lb (106.6 kg)  10/30/20 244 lb (110.7 kg)    BP 122/68   Pulse 76   Temp 98 F (36.7 C) (Oral)   Ht 6' (1.829 m)   Wt 233 lb 12.8 oz (106.1 kg)   SpO2 97%   BMI 31.71 kg/m   Assessment and Plan: 1. Annual physical exam Exam is normal except for weight. Encourage regular exercise and appropriate dietary changes. Recommend off-loading his right toe to improve healing Return for Flu vaccine in 2-3 weeks Prevnar-20 next visit - Hemoglobin A1c  2. Colon cancer screening Due for screening - Ambulatory  referral to Gastroenterology  3.  Prostate cancer screening DRE deferred - PSA  4. Essential (primary) hypertension Clinically stable exam with well controlled BP. Tolerating medications without side effects at this time. Pt to continue current regimen and low sodium diet; benefits of regular exercise as able discussed. - CBC with Differential/Platelet - Comprehensive metabolic panel - POCT urinalysis dipstick - losartan (COZAAR) 100 MG tablet; Take 1 tablet (100 mg total) by mouth daily.  Dispense: 90 tablet; Refill: 3 - metoprolol succinate (TOPROL-XL) 25 MG 24 hr tablet; Take 1 tablet (25 mg total) by mouth daily.  Dispense: 90 tablet; Refill: 1  5. Dyslipidemia - Lipid panel - pravastatin (PRAVACHOL) 20 MG tablet; Take 1 tablet (20 mg total) by mouth daily.  Dispense: 90 tablet; Refill: 1  6. Stage 3a chronic kidney disease ([place]) Check labs and advise Continue to monitor due to nsaid and allopurinol use - Comprehensive metabolic panel  7. Need for vaccination for pneumococcus Will complete next visit  8. Idiopathic chronic gout of foot without tophus, unspecified laterality - Uric acid - allopurinol (ZYLOPRIM) 300 MG tablet; Take 1.5 tablets (450 mg total) by mouth daily.  Dispense: 90 tablet; Refill: 1  9. Acute non-recurrent maxillary sinusitis Recommend fluids and rest Can use otc cold/sinus meds if needed - azithromycin (ZITHROMAX Z-PAK) 250 MG tablet; UAD  Dispense: 6 each; Refill: 0   Partially dictated using Editor, commissioning. Any errors are unintentional.  Halina Maidens, MD [facility]  06/10/2021

## 2021-06-11 LAB — COMPREHENSIVE METABOLIC PANEL
ALT: 16 IU/L (ref 0–44)
AST: 24 IU/L (ref 0–40)
Albumin/Globulin Ratio: 2.1 (ref 1.2–2.2)
Albumin: 4.6 g/dL (ref 3.8–4.8)
Alkaline Phosphatase: 74 IU/L (ref 44–121)
BUN/Creatinine Ratio: 16 (ref 10–24)
BUN: 28 mg/dL — ABNORMAL HIGH (ref 8–27)
Bilirubin Total: 0.6 mg/dL (ref 0.0–1.2)
CO2: 24 mmol/L (ref 20–29)
Calcium: 9.5 mg/dL (ref 8.6–10.2)
Chloride: 101 mmol/L (ref 96–106)
Creatinine, Ser: 1.78 mg/dL — ABNORMAL HIGH (ref 0.76–1.27)
Globulin, Total: 2.2 g/dL (ref 1.5–4.5)
Glucose: 104 mg/dL — ABNORMAL HIGH (ref 70–99)
Potassium: 4.7 mmol/L (ref 3.5–5.2)
Sodium: 140 mmol/L (ref 134–144)
Total Protein: 6.8 g/dL (ref 6.0–8.5)
eGFR: 42 mL/min/{1.73_m2} — ABNORMAL LOW (ref >59)

## 2021-06-11 LAB — LIPID PANEL
Chol/HDL Ratio: 4.6 ratio (ref 0.0–5.0)
Cholesterol, Total: 138 mg/dL (ref 100–199)
HDL: 30 mg/dL — ABNORMAL LOW (ref >39)
LDL Chol Calc (NIH): 79 mg/dL (ref 0–99)
Triglycerides: 169 mg/dL — ABNORMAL HIGH (ref 0–149)
VLDL Cholesterol Cal: 29 mg/dL (ref 5–40)

## 2021-06-11 LAB — CBC WITH DIFFERENTIAL/PLATELET
Basophils Absolute: 0 10*3/uL (ref 0.0–0.2)
Basos: 0 %
EOS (ABSOLUTE): 0.3 10*3/uL (ref 0.0–0.4)
Eos: 5 %
Hematocrit: 36.7 % — ABNORMAL LOW (ref 37.5–51.0)
Hemoglobin: 12.6 g/dL — ABNORMAL LOW (ref 13.0–17.7)
Immature Grans (Abs): 0 10*3/uL (ref 0.0–0.1)
Immature Granulocytes: 0 %
Lymphocytes Absolute: 1.7 10*3/uL (ref 0.7–3.1)
Lymphs: 29 %
MCH: 31.9 pg (ref 26.6–33.0)
MCHC: 34.3 g/dL (ref 31.5–35.7)
MCV: 93 fL (ref 79–97)
Monocytes Absolute: 1 10*3/uL — ABNORMAL HIGH (ref 0.1–0.9)
Monocytes: 17 %
Neutrophils Absolute: 2.8 10*3/uL (ref 1.4–7.0)
Neutrophils: 49 %
Platelets: 151 10*3/uL (ref 150–450)
RBC: 3.95 x10E6/uL — ABNORMAL LOW (ref 4.14–5.80)
RDW: 11.9 % (ref 11.6–15.4)
WBC: 5.9 10*3/uL (ref 3.4–10.8)

## 2021-06-11 LAB — HEMOGLOBIN A1C
Est. average glucose Bld gHb Est-mCnc: 120 mg/dL
Hgb A1c MFr Bld: 5.8 % — ABNORMAL HIGH (ref 4.8–5.6)

## 2021-06-11 LAB — PSA: Prostate Specific Ag, Serum: 2.3 ng/mL (ref 0.0–4.0)

## 2021-06-11 LAB — URIC ACID: Uric Acid: 4.7 mg/dL (ref 3.8–8.4)

## 2021-07-03 ENCOUNTER — Telehealth: Payer: Self-pay

## 2021-07-03 NOTE — Telephone Encounter (Signed)
CALLED PATIENT NO ANSWER LEFT VOICEMAIL FOR A CALL BACK °Letter sent °

## 2021-07-03 NOTE — Telephone Encounter (Signed)
Sent letter

## 2021-07-15 DIAGNOSIS — G629 Polyneuropathy, unspecified: Secondary | ICD-10-CM | POA: Diagnosis not present

## 2021-07-15 DIAGNOSIS — M1A00X0 Idiopathic chronic gout, unspecified site, without tophus (tophi): Secondary | ICD-10-CM | POA: Diagnosis not present

## 2021-07-15 DIAGNOSIS — M65331 Trigger finger, right middle finger: Secondary | ICD-10-CM | POA: Diagnosis not present

## 2021-07-15 DIAGNOSIS — M159 Polyosteoarthritis, unspecified: Secondary | ICD-10-CM | POA: Diagnosis not present

## 2021-07-17 DIAGNOSIS — M25511 Pain in right shoulder: Secondary | ICD-10-CM | POA: Diagnosis not present

## 2021-07-17 DIAGNOSIS — M25519 Pain in unspecified shoulder: Secondary | ICD-10-CM | POA: Diagnosis not present

## 2021-07-17 DIAGNOSIS — R52 Pain, unspecified: Secondary | ICD-10-CM | POA: Diagnosis not present

## 2021-07-17 DIAGNOSIS — G894 Chronic pain syndrome: Secondary | ICD-10-CM | POA: Diagnosis not present

## 2021-07-17 DIAGNOSIS — G5600 Carpal tunnel syndrome, unspecified upper limb: Secondary | ICD-10-CM | POA: Diagnosis not present

## 2021-07-17 DIAGNOSIS — Z79891 Long term (current) use of opiate analgesic: Secondary | ICD-10-CM | POA: Diagnosis not present

## 2021-07-17 DIAGNOSIS — M792 Neuralgia and neuritis, unspecified: Secondary | ICD-10-CM | POA: Diagnosis not present

## 2021-07-30 ENCOUNTER — Telehealth: Payer: Medicare Other | Admitting: Internal Medicine

## 2021-08-02 DIAGNOSIS — M25511 Pain in right shoulder: Secondary | ICD-10-CM | POA: Diagnosis not present

## 2021-08-08 DIAGNOSIS — R0789 Other chest pain: Secondary | ICD-10-CM | POA: Diagnosis not present

## 2021-08-08 DIAGNOSIS — I10 Essential (primary) hypertension: Secondary | ICD-10-CM | POA: Diagnosis not present

## 2021-08-08 DIAGNOSIS — E782 Mixed hyperlipidemia: Secondary | ICD-10-CM | POA: Diagnosis not present

## 2021-08-08 DIAGNOSIS — R252 Cramp and spasm: Secondary | ICD-10-CM | POA: Diagnosis not present

## 2021-08-08 LAB — BASIC METABOLIC PANEL
BUN: 27 — AB (ref 4–21)
CO2: 24 — AB (ref 13–22)
Chloride: 108 (ref 99–108)
Creatinine: 1.5 — AB (ref 0.6–1.3)
Potassium: 4.3 mEq/L (ref 3.5–5.1)
Sodium: 138 (ref 137–147)

## 2021-08-08 LAB — COMPREHENSIVE METABOLIC PANEL: eGFR: 51

## 2021-08-08 LAB — LIPID PANEL
Cholesterol: 209 — AB (ref 0–200)
HDL: 30 — AB (ref 35–70)
LDL Cholesterol: 128
Triglycerides: 255 — AB (ref 40–160)

## 2021-08-13 ENCOUNTER — Other Ambulatory Visit: Payer: Self-pay | Admitting: Internal Medicine

## 2021-08-13 DIAGNOSIS — I10 Essential (primary) hypertension: Secondary | ICD-10-CM

## 2021-08-13 NOTE — Telephone Encounter (Signed)
Requested Prescriptions  Pending Prescriptions Disp Refills   metoprolol succinate (TOPROL-XL) 25 MG 24 hr tablet [Pharmacy Med Name: METOPROLOL SUCC ER 25 MG TAB] 90 tablet 1    Sig: TAKE 1 TABLET BY MOUTH EVERY DAY     Cardiovascular:  Beta Blockers Passed - 08/13/2021 11:02 AM      Passed - Last BP in normal range    BP Readings from Last 1 Encounters:  06/10/21 122/68         Passed - Last Heart Rate in normal range    Pulse Readings from Last 1 Encounters:  06/10/21 76         Passed - Valid encounter within last 6 months    Recent Outpatient Visits          2 months ago Annual physical exam   [facility] Glean Hess, MD   9 months ago Essential (primary) hypertension   [facility] Glean Hess, MD   1 year ago Annual physical exam   [facility] Glean Hess, MD   1 year ago Left lower quadrant abdominal tenderness without rebound tenderness   [facility] Glean Hess, MD   2 years ago Annual physical exam   [facility] Glean Hess, MD      Future Appointments            In 3 months Army Melia Jesse Sans, MD [facility], [place]   In 10 months Army Melia Jesse Sans, MD [facility], [facility]

## 2021-09-06 DIAGNOSIS — G4733 Obstructive sleep apnea (adult) (pediatric): Secondary | ICD-10-CM | POA: Diagnosis not present

## 2021-09-25 DIAGNOSIS — E782 Mixed hyperlipidemia: Secondary | ICD-10-CM | POA: Diagnosis not present

## 2021-09-25 DIAGNOSIS — I2510 Atherosclerotic heart disease of native coronary artery without angina pectoris: Secondary | ICD-10-CM | POA: Diagnosis not present

## 2021-09-25 DIAGNOSIS — I10 Essential (primary) hypertension: Secondary | ICD-10-CM | POA: Diagnosis not present

## 2021-09-30 ENCOUNTER — Other Ambulatory Visit: Payer: Self-pay | Admitting: Internal Medicine

## 2021-09-30 DIAGNOSIS — E785 Hyperlipidemia, unspecified: Secondary | ICD-10-CM

## 2021-10-01 NOTE — Telephone Encounter (Signed)
Requested medication (s) are due for refill today:   No  Requested medication (s) are on the active medication list:   Yes  Future visit scheduled:   Yes   Last ordered: 06/10/2021 #90, 1 refill  Returned because protocol criteria not met.   2 values out of range.   Requested Prescriptions  Pending Prescriptions Disp Refills   pravastatin (PRAVACHOL) 20 MG tablet [Pharmacy Med Name: PRAVASTATIN SODIUM 20 MG TAB] 90 tablet 1    Sig: TAKE 1 TABLET BY MOUTH EVERY DAY     Cardiovascular:  Antilipid - Statins Failed - 09/30/2021  1:47 AM      Failed - Lipid Panel in normal range within the last 12 months    Cholesterol, Total  Date Value Ref Range Status  06/10/2021 138 100 - 199 mg/dL Final   LDL Chol Calc (NIH)  Date Value Ref Range Status  06/10/2021 79 0 - 99 mg/dL Final   HDL  Date Value Ref Range Status  06/10/2021 30 (L) >39 mg/dL Final   Triglycerides  Date Value Ref Range Status  06/10/2021 169 (H) 0 - 149 mg/dL Final         Passed - Patient is not pregnant      Passed - Valid encounter within last 12 months    Recent Outpatient Visits           3 months ago Annual physical exam   [facility] Glean Hess, MD   11 months ago Essential (primary) hypertension   [facility] Glean Hess, MD   1 year ago Annual physical exam   [facility] Glean Hess, MD   1 year ago Left lower quadrant abdominal tenderness without rebound tenderness   [facility] Glean Hess, MD   2 years ago Annual physical exam   [facility] Glean Hess, MD       Future Appointments             In 2 months Army Melia Jesse Sans, MD [facility], [place]   In 8 months Army Melia, Jesse Sans, MD [facility], [facility]

## 2021-10-10 DIAGNOSIS — R52 Pain, unspecified: Secondary | ICD-10-CM | POA: Diagnosis not present

## 2021-10-10 DIAGNOSIS — G5603 Carpal tunnel syndrome, bilateral upper limbs: Secondary | ICD-10-CM | POA: Diagnosis not present

## 2021-10-10 DIAGNOSIS — M792 Neuralgia and neuritis, unspecified: Secondary | ICD-10-CM | POA: Diagnosis not present

## 2021-10-10 DIAGNOSIS — Z79891 Long term (current) use of opiate analgesic: Secondary | ICD-10-CM | POA: Diagnosis not present

## 2021-10-10 DIAGNOSIS — G894 Chronic pain syndrome: Secondary | ICD-10-CM | POA: Diagnosis not present

## 2021-10-10 DIAGNOSIS — Z5181 Encounter for therapeutic drug level monitoring: Secondary | ICD-10-CM | POA: Diagnosis not present

## 2021-10-10 DIAGNOSIS — G5600 Carpal tunnel syndrome, unspecified upper limb: Secondary | ICD-10-CM | POA: Diagnosis not present

## 2021-10-10 DIAGNOSIS — M542 Cervicalgia: Secondary | ICD-10-CM | POA: Diagnosis not present

## 2021-10-17 DIAGNOSIS — M1A0790 Idiopathic chronic gout, unspecified ankle and foot, without tophus (tophi): Secondary | ICD-10-CM | POA: Diagnosis not present

## 2021-10-17 DIAGNOSIS — G4733 Obstructive sleep apnea (adult) (pediatric): Secondary | ICD-10-CM | POA: Diagnosis not present

## 2021-10-17 DIAGNOSIS — Z1211 Encounter for screening for malignant neoplasm of colon: Secondary | ICD-10-CM | POA: Diagnosis not present

## 2021-10-17 DIAGNOSIS — I2510 Atherosclerotic heart disease of native coronary artery without angina pectoris: Secondary | ICD-10-CM | POA: Diagnosis not present

## 2021-10-17 DIAGNOSIS — Z0000 Encounter for general adult medical examination without abnormal findings: Secondary | ICD-10-CM | POA: Diagnosis not present

## 2021-10-17 DIAGNOSIS — E782 Mixed hyperlipidemia: Secondary | ICD-10-CM | POA: Diagnosis not present

## 2021-10-17 DIAGNOSIS — I129 Hypertensive chronic kidney disease with stage 1 through stage 4 chronic kidney disease, or unspecified chronic kidney disease: Secondary | ICD-10-CM | POA: Diagnosis not present

## 2021-10-17 DIAGNOSIS — Z79899 Other long term (current) drug therapy: Secondary | ICD-10-CM | POA: Diagnosis not present

## 2021-10-17 DIAGNOSIS — Z9989 Dependence on other enabling machines and devices: Secondary | ICD-10-CM | POA: Diagnosis not present

## 2021-10-17 DIAGNOSIS — N183 Chronic kidney disease, stage 3 unspecified: Secondary | ICD-10-CM | POA: Diagnosis not present

## 2021-11-06 IMAGING — CR DG TOE 5TH 2+V*R*
3 series · 3 of 3 positions shown · non-contrast
Comparison: None.

CLINICAL DATA: Acute onset of pain involving the RIGHT fifth toe
that began last night and capped the patient awake. Current history
of peripheral neuropathy in gout.

EXAM:
RIGHT FIFTH TOE 3 VIEWS

[toe ap]
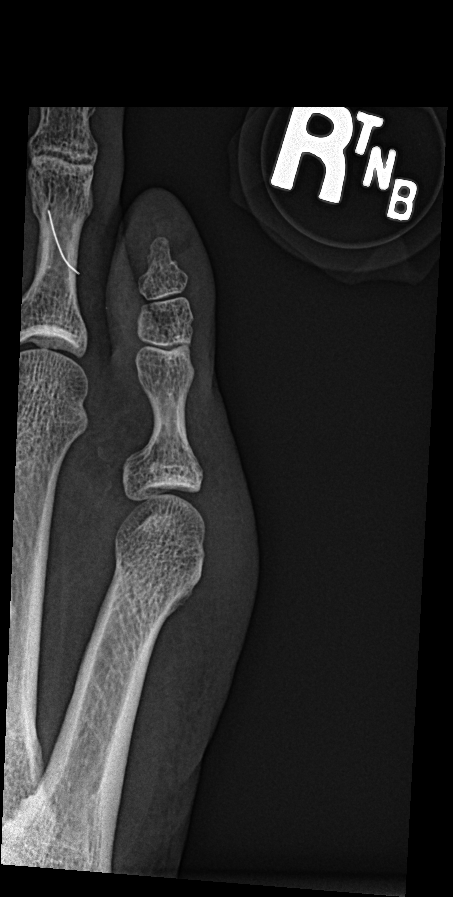

[toe obl]
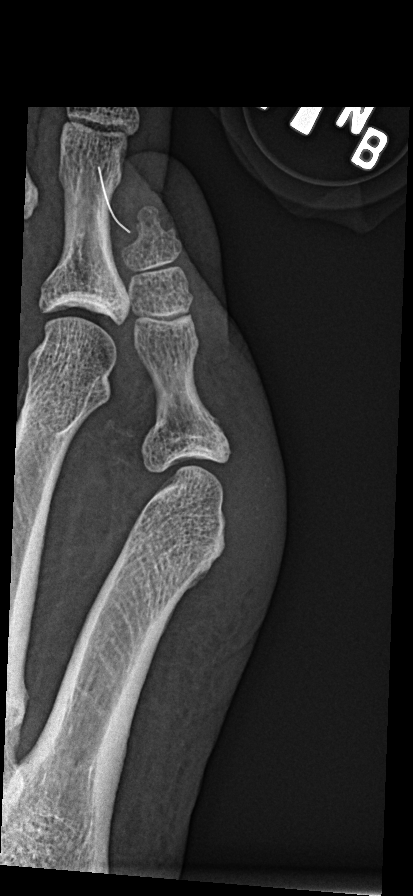

[toe lat]
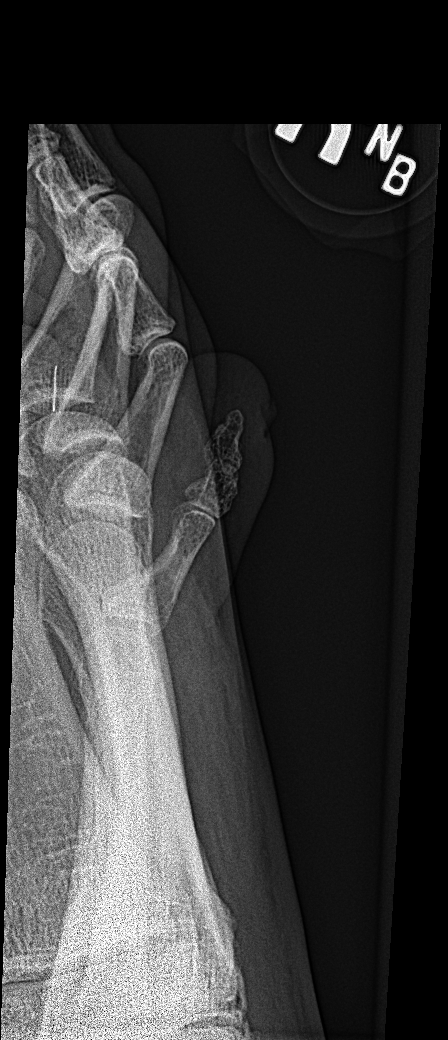

[3 of 3 positions shown; findings below may reference images not displayed]

FINDINGS: No evidence of acute, subacute or healed fractures. Joint spaces
well preserved patient age. No marginal erosions to confirm a
diagnosis of gout.

Note is made of a thin linear metallic foreign body (wire?) In the
soft tissues of the dorsum of the fourth toe underlying the proximal
phalanx.
IMPRESSION: 1. No osseous abnormality. No evidence of gout.
2. Thin linear metallic foreign body (wire?) in the soft tissues of
the dorsum of the fourth toe underlying the proximal phalanx.

## 2021-12-04 DIAGNOSIS — G4733 Obstructive sleep apnea (adult) (pediatric): Secondary | ICD-10-CM | POA: Diagnosis not present

## 2021-12-09 ENCOUNTER — Ambulatory Visit (INDEPENDENT_AMBULATORY_CARE_PROVIDER_SITE_OTHER): Payer: Medicare Other | Admitting: Internal Medicine

## 2021-12-09 ENCOUNTER — Encounter: Payer: Self-pay | Admitting: Internal Medicine

## 2021-12-09 VITALS — BP 130/68 | HR 67 | Ht 72.0 in | Wt 239.0 lb

## 2021-12-09 DIAGNOSIS — I779 Disorder of arteries and arterioles, unspecified: Secondary | ICD-10-CM

## 2021-12-09 DIAGNOSIS — N1831 Chronic kidney disease, stage 3a: Secondary | ICD-10-CM | POA: Diagnosis not present

## 2021-12-09 DIAGNOSIS — I10 Essential (primary) hypertension: Secondary | ICD-10-CM

## 2021-12-09 NOTE — Progress Notes (Signed)
? ? ?Date:  12/09/2021  ? ?Name:  Brian Payne   DOB:  06/09/1956   MRN:  [id number] ? ? ?Chief Complaint: Hypertension and Hyperlipidemia ? ?Hypertension ?This is a chronic problem. The problem is controlled. Pertinent negatives include no chest pain, headaches, palpitations or shortness of breath. Past treatments include angiotensin blockers and beta blockers. The current treatment provides significant improvement. Hypertensive end-organ damage includes kidney disease and CAD/MI. There is no history of CVA.  ?Hyperlipidemia ?This is a chronic problem. The problem is uncontrolled. Recent lipid tests were reviewed and are high. Pertinent negatives include no chest pain or shortness of breath. Current antihyperlipidemic treatment includes ezetimibe. The current treatment provides mild improvement of lipids.  ? ?Lab Results  ?Component Value Date  ? NA 138 08/08/2021  ? K 4.3 08/08/2021  ? CO2 24 (A) 08/08/2021  ? GLUCOSE 104 (H) 06/10/2021  ? BUN 27 (A) 08/08/2021  ? CREATININE 1.5 (A) 08/08/2021  ? CALCIUM 9.5 06/10/2021  ? EGFR 51 08/08/2021  ? GFRNONAA 49 (L) 06/05/2020  ? ?Lab Results  ?Component Value Date  ? CHOL 209 (A) 08/08/2021  ? HDL 30 (A) 08/08/2021  ? [place] 128 08/08/2021  ? TRIG 255 (A) 08/08/2021  ? CHOLHDL 4.6 06/10/2021  ? ?Lab Results  ?Component Value Date  ? TSH 1.110 01/17/2015  ? ?Lab Results  ?Component Value Date  ? HGBA1C 5.8 (H) 06/10/2021  ? ?Lab Results  ?Component Value Date  ? WBC 5.9 06/10/2021  ? HGB 12.6 (L) 06/10/2021  ? HCT 36.7 (L) 06/10/2021  ? MCV 93 06/10/2021  ? PLT 151 06/10/2021  ? ?Lab Results  ?Component Value Date  ? ALT 16 06/10/2021  ? AST 24 06/10/2021  ? ALKPHOS 74 06/10/2021  ? BILITOT 0.6 06/10/2021  ? ?No results found for: 25OHVITD2, [place], VD25OH  ? ?Review of Systems  ?Constitutional:  Negative for fatigue and unexpected weight change.  ?HENT:  Negative for nosebleeds.   ?Eyes:  Negative for visual disturbance.  ?Respiratory:  Negative for cough, chest  tightness, shortness of breath and wheezing.   ?Cardiovascular:  Negative for chest pain, palpitations and leg swelling.  ?Gastrointestinal:  Negative for abdominal pain, constipation and diarrhea.  ?Genitourinary:  Flank pain: in hands and feet.  ?Neurological:  Positive for numbness. Negative for dizziness, weakness, light-headedness and headaches.  ? ?Patient Active Problem List  ? Diagnosis Date Noted  ? Increased prostate specific antigen (PSA) velocity 06/06/2020  ? CKD (chronic kidney disease), stage III ([place]) 05/25/2017  ? Mild carotid artery disease ([place]) 02/25/2015  ? Tinnitus of left ear 02/12/2015  ? Cervical disc disease 11/20/2014  ? Tubular adenoma of colon 11/20/2014  ? Idiopathic chronic gout of foot without tophus 11/20/2014  ? Dyslipidemia 11/20/2014  ? ED (erectile dysfunction) of organic origin 11/20/2014  ? Essential (primary) hypertension 11/20/2014  ? Idiopathic peripheral neuropathy 11/20/2014  ? Obstructive apnea 10/08/2011  ? Coronary artery disease involving native coronary artery of native heart without angina pectoris 10/08/2011  ? ? ?Allergies  ?Allergen Reactions  ? Crestor [Rosuvastatin] Other (See Comments)  ?  myalgia  ? Levofloxacin Other (See Comments)  ?  dizziness  ? Codeine Itching and Rash  ? Nortriptyline Other (See Comments)  ?  Urinary problems  ? ? ?Past Surgical History:  ?Procedure Laterality Date  ? ANTERIOR FUSION CERVICAL SPINE  01/2016  ? BILATERAL CARPAL TUNNEL RELEASE  09/2013  ? COLONOSCOPY  08/14/2010  ? TA - rec 3 yr follow  up Roxy Manns)  ? CORONARY ANGIOPLASTY WITH STENT PLACEMENT  [phone]  ? [place]  ? CORONARY ANGIOPLASTY WITH STENT PLACEMENT  06/05/2017  ? PCI of LCx with placement of 4.0 x 31m DES  ? DISTAL BICEPS TENDON REPAIR Right 03/30/2017  ? Procedure: DISTAL BICEPS TENDON REPAIR;  Surgeon: MEarnestine Leys MD;  Location: [facility] ORS;  Service: Orthopedics;  Laterality: Right;  ? HYDROCELE EXCISION    ? VASECTOMY    ? ? ?Social History  ? ?Tobacco Use  ? Smoking  status: Never  ? Smokeless tobacco: Never  ?Vaping Use  ? Vaping Use: Never used  ?Substance Use Topics  ? Alcohol use: No  ?  Alcohol/week: 0.0 standard drinks  ? Drug use: No  ? ? ? ?Medication list has been reviewed and updated. ? ?Current Meds  ?Medication Sig  ? allopurinol (ZYLOPRIM) 300 MG tablet Take 1.5 tablets (450 mg total) by mouth daily.  ? amLODipine (NORVASC) 2.5 MG tablet Take 2.5 mg by mouth daily.  ? aspirin EC 81 MG tablet Take 81 mg by mouth daily.  ? b complex vitamins tablet Take 1 tablet by mouth daily.  ? gabapentin (NEURONTIN) 100 MG capsule Take 100 mg by mouth 4 (four) times daily.  ? GINKGO BILOBA PO Take 1 tablet by mouth daily.  ? GLUCOSAMINE HCL PO Take 1,500 mg by mouth daily.   ? HYDROcodone-acetaminophen (NORCO) 10-325 MG tablet Take 1 tablet by mouth 2 (two) times daily as needed.  ? losartan (COZAAR) 100 MG tablet Take 1 tablet (100 mg total) by mouth daily.  ? meloxicam (MOBIC) 7.5 MG tablet Take 7.5-15 mg by mouth daily as needed.   ? metoprolol succinate (TOPROL-XL) 25 MG 24 hr tablet TAKE 1 TABLET BY MOUTH EVERY DAY  ? Multiple Vitamin (MULTIVITAMIN WITH MINERALS) TABS tablet Take 1 tablet by mouth daily.  ? nitroGLYCERIN (NITROSTAT) 0.4 MG SL tablet Place 1 tablet (0.4 mg total) under the tongue every 5 (five) minutes as needed for chest pain.  ? NON FORMULARY CPAP @@ 10 cm H2O  ? Omega-3 Fatty Acids (FISH OIL) 1200 MG CAPS Take 1,200 mg by mouth daily.   ? Oxymetazoline HCl-Menthol (AFRIN MENTHOL SPRAY NA) Place into the nose at bedtime.  ? pravastatin (PRAVACHOL) 20 MG tablet Take 20 mg by mouth daily.  ? ? ? ?  10/30/2020  ?  8:20 AM 06/05/2020  ?  8:49 AM 05/28/2020  ? 10:52 AM  ?GAD 7 : Generalized Anxiety Score  ?Nervous, Anxious, on Edge 0 1 0  ?Control/stop worrying 0 1 0  ?Worry too much - different things 0 1 0  ?Trouble relaxing 0 0 0  ?Restless 0 0 0  ?Easily annoyed or irritable 0 0 0  ?Afraid - awful might happen 0 0 0  ?Total GAD 7 Score 0 3 0  ?Anxiety  Difficulty  Not difficult at all Not difficult at all  ? ? ? ?  10/30/2020  ?  8:20 AM  ?Depression screen PHQ 2/9  ?Decreased Interest 0  ?Down, Depressed, Hopeless 0  ?PHQ - 2 Score 0  ?Altered sleeping 0  ?Tired, decreased energy 0  ?Change in appetite 0  ?Feeling bad or failure about yourself  0  ?Trouble concentrating 0  ?Moving slowly or fidgety/restless 0  ?Suicidal thoughts 0  ?PHQ-9 Score 0  ? ? ?BP Readings from Last 3 Encounters:  ?12/09/21 130/68  ?06/10/21 122/68  ?11/10/20 118/69  ? ? ?Physical Exam ?Vitals and nursing note  reviewed.  ?Constitutional:   ?   General: He is not in acute distress. ?   Appearance: He is well-developed.  ?HENT:  ?   Head: Normocephalic and atraumatic.  ?Cardiovascular:  ?   Rate and Rhythm: Normal rate and regular rhythm.  ?   Pulses: Normal pulses.  ?   Heart sounds: No murmur heard. ?Pulmonary:  ?   Effort: Pulmonary effort is normal. No respiratory distress.  ?   Breath sounds: No wheezing or rhonchi.  ?Musculoskeletal:  ?   Cervical back: Normal range of motion.  ?   Right lower leg: No edema.  ?   Left lower leg: No edema.  ?Lymphadenopathy:  ?   Cervical: No cervical adenopathy.  ?Skin: ?   General: Skin is warm and dry.  ?   Capillary Refill: Capillary refill takes less than 2 seconds.  ?   Findings: No rash.  ?Neurological:  ?   General: No focal deficit present.  ?   Mental Status: He is alert and oriented to person, place, and time.  ?Psychiatric:     ?   Mood and Affect: Mood normal.     ?   Behavior: Behavior normal.  ? ? ?Wt Readings from Last 3 Encounters:  ?12/09/21 239 lb (108.4 kg)  ?06/10/21 233 lb 12.8 oz (106.1 kg)  ?11/10/20 235 lb (106.6 kg)  ? ? ?BP 130/68   Pulse 67   Ht 6' (1.829 m)   Wt 239 lb (108.4 kg)   SpO2 96%   BMI 32.41 kg/m?  ? ?Assessment and Plan: ?1. Essential (primary) hypertension ?Clinically stable exam with well controlled BP. ?Tolerating medications without side effects at this time. ?Pt to continue current regimen and low  sodium diet; benefits of regular exercise as able discussed. ? ?2. Mild carotid artery disease ([place]) ?Now tolerating pravastatin 20 mg daily ?Has Cardiology follow up in 3 months to recheck ? ?3. Stage 3a chronic kidney d

## 2021-12-17 ENCOUNTER — Telehealth: Payer: Self-pay | Admitting: Internal Medicine

## 2021-12-17 NOTE — Telephone Encounter (Signed)
Patient declined the Medicare Wellness Visit with [facility]  

## 2021-12-31 DIAGNOSIS — M65312 Trigger thumb, left thumb: Secondary | ICD-10-CM | POA: Diagnosis not present

## 2022-01-03 DIAGNOSIS — R7309 Other abnormal glucose: Secondary | ICD-10-CM | POA: Diagnosis not present

## 2022-01-03 DIAGNOSIS — I10 Essential (primary) hypertension: Secondary | ICD-10-CM | POA: Diagnosis not present

## 2022-01-03 DIAGNOSIS — E782 Mixed hyperlipidemia: Secondary | ICD-10-CM | POA: Diagnosis not present

## 2022-01-03 DIAGNOSIS — Z79899 Other long term (current) drug therapy: Secondary | ICD-10-CM | POA: Diagnosis not present

## 2022-01-10 DIAGNOSIS — M792 Neuralgia and neuritis, unspecified: Secondary | ICD-10-CM | POA: Diagnosis not present

## 2022-01-10 DIAGNOSIS — M25512 Pain in left shoulder: Secondary | ICD-10-CM | POA: Diagnosis not present

## 2022-01-10 DIAGNOSIS — Z79891 Long term (current) use of opiate analgesic: Secondary | ICD-10-CM | POA: Diagnosis not present

## 2022-01-10 DIAGNOSIS — G894 Chronic pain syndrome: Secondary | ICD-10-CM | POA: Diagnosis not present

## 2022-01-10 DIAGNOSIS — G5603 Carpal tunnel syndrome, bilateral upper limbs: Secondary | ICD-10-CM | POA: Diagnosis not present

## 2022-01-10 DIAGNOSIS — M25511 Pain in right shoulder: Secondary | ICD-10-CM | POA: Diagnosis not present

## 2022-01-24 DIAGNOSIS — I2510 Atherosclerotic heart disease of native coronary artery without angina pectoris: Secondary | ICD-10-CM | POA: Diagnosis not present

## 2022-01-24 DIAGNOSIS — I129 Hypertensive chronic kidney disease with stage 1 through stage 4 chronic kidney disease, or unspecified chronic kidney disease: Secondary | ICD-10-CM | POA: Diagnosis not present

## 2022-01-24 DIAGNOSIS — E785 Hyperlipidemia, unspecified: Secondary | ICD-10-CM | POA: Diagnosis not present

## 2022-01-24 DIAGNOSIS — Z79899 Other long term (current) drug therapy: Secondary | ICD-10-CM | POA: Diagnosis not present

## 2022-01-24 DIAGNOSIS — Z0000 Encounter for general adult medical examination without abnormal findings: Secondary | ICD-10-CM | POA: Diagnosis not present

## 2022-01-24 DIAGNOSIS — M109 Gout, unspecified: Secondary | ICD-10-CM | POA: Diagnosis not present

## 2022-01-24 DIAGNOSIS — N189 Chronic kidney disease, unspecified: Secondary | ICD-10-CM | POA: Diagnosis not present

## 2022-02-08 ENCOUNTER — Other Ambulatory Visit: Payer: Self-pay | Admitting: Internal Medicine

## 2022-02-08 DIAGNOSIS — I10 Essential (primary) hypertension: Secondary | ICD-10-CM

## 2022-02-10 NOTE — Telephone Encounter (Signed)
Requested Prescriptions  Pending Prescriptions Disp Refills  . metoprolol succinate (TOPROL-XL) 25 MG 24 hr tablet [Pharmacy Med Name: METOPROLOL SUCC ER 25 MG TAB] 90 tablet 1    Sig: TAKE 1 TABLET BY MOUTH EVERY DAY     Cardiovascular:  Beta Blockers Passed - 02/08/2022 11:12 AM      Passed - Last BP in normal range    BP Readings from Last 1 Encounters:  12/09/21 130/68         Passed - Last Heart Rate in normal range    Pulse Readings from Last 1 Encounters:  12/09/21 67         Passed - Valid encounter within last 6 months    Recent Outpatient Visits          2 months ago Essential (primary) hypertension   [facility]an Hess, [facility]   8 months ago Annual physical exam   [facility] Glean Hess, MD   1 year ago Essential (primary) hypertension   [facility] Glean Hess, MD   1 year ago Annual physical exam   [facility] Glean Hess, MD   1 year ago Left lower quadrant abdominal tenderness without rebound tenderness   [facility] Glean Hess, MD      Future Appointments            In 4 months Army Melia Jesse Sans, MD [facility], [facility]

## 2022-02-18 DIAGNOSIS — Z79899 Other long term (current) drug therapy: Secondary | ICD-10-CM | POA: Diagnosis not present

## 2022-03-20 DIAGNOSIS — M1A0790 Idiopathic chronic gout, unspecified ankle and foot, without tophus (tophi): Secondary | ICD-10-CM | POA: Diagnosis not present

## 2022-03-26 DIAGNOSIS — E782 Mixed hyperlipidemia: Secondary | ICD-10-CM | POA: Diagnosis not present

## 2022-03-26 DIAGNOSIS — I10 Essential (primary) hypertension: Secondary | ICD-10-CM | POA: Diagnosis not present

## 2022-03-26 DIAGNOSIS — I2510 Atherosclerotic heart disease of native coronary artery without angina pectoris: Secondary | ICD-10-CM | POA: Diagnosis not present

## 2022-05-12 ENCOUNTER — Encounter: Payer: Self-pay | Admitting: Emergency Medicine

## 2022-05-12 ENCOUNTER — Ambulatory Visit
Admission: EM | Admit: 2022-05-12 | Discharge: 2022-05-12 | Disposition: A | Discharge status: Home / Self Care | Payer: Medicare Other

## 2022-05-12 DIAGNOSIS — S61217A Laceration without foreign body of left little finger without damage to nail, initial encounter: Secondary | ICD-10-CM | POA: Diagnosis not present

## 2022-05-12 NOTE — ED Provider Notes (Signed)
[facility]    CSN: [id number] Arrival date & time: 05/12/22  1634      History   Chief Complaint Chief Complaint  Patient presents with   Laceration    HPI Brian Payne is a 66 y.o. male presenting for laceration of the left fifth digit that occurred about 2 hours ago.  Patient reports he cut his finger with a very sharp fillet knife.  He says that he tried to apply pressure and the bleeding would not stop so he applied a type of clotting powder to the wound and it finally got the bleeding to stop.  He is here because he not sure how deep the wound is.  It has not been rebleeding at all.  He is not having any significant pain.  He is denying any problems with movement of the left finger and he has not had any numbness or weakness.  He is up-to-date with his tetanus as of 2020.  He has no other complaints.  HPI  Past Medical History:  Diagnosis Date   Arthritis    Chronic neck pain    Coronary artery disease    Difficult intubation    Gout    H/O heart artery stent    History of hiatal hernia    Hypertension    Hypertension    Nerve pain    idiopathic peripheral neuropathy   Sleep apnea    Sleep apnea with use of continuous positive airway pressure (CPAP)     Patient Active Problem List   Diagnosis Date Noted   Increased prostate specific antigen (PSA) velocity 06/06/2020   CKD (chronic kidney disease), stage III ([place]) 05/25/2017   Mild carotid artery disease ([place]) 02/25/2015   Tinnitus of left ear 02/12/2015   Cervical disc disease 11/20/2014   Tubular adenoma of colon 11/20/2014   Idiopathic chronic gout of foot without tophus 11/20/2014   Dyslipidemia 11/20/2014   ED (erectile dysfunction) of organic origin 11/20/2014   Essential (primary) hypertension 11/20/2014   Idiopathic peripheral neuropathy 11/20/2014   Obstructive apnea 10/08/2011   Coronary artery disease involving native coronary artery of native heart without angina pectoris 10/08/2011     Past Surgical History:  Procedure Laterality Date   ANTERIOR FUSION CERVICAL SPINE  01/2016   BILATERAL CARPAL TUNNEL RELEASE  09/2013   COLONOSCOPY  08/14/2010   TA - rec 3 yr follow up ([place])   CORONARY ANGIOPLASTY WITH STENT PLACEMENT  272536   [facility]   CORONARY ANGIOPLASTY WITH STENT PLACEMENT  06/05/2017   PCI of LCx with placement of 4.0 x 24m DES   DISTAL BICEPS TENDON REPAIR Right 03/30/2017   Procedure: DISTAL BICEPS TENDON REPAIR;  Surgeon: MEarnestine Leys MD;  Location: [facility] ORS;  Service: Orthopedics;  Laterality: Right;   HYDROCELE EXCISION     VASECTOMY         Home Medications    Prior to Admission medications   Medication Sig Start Date End Date Taking? Authorizing Provider  allopurinol (ZYLOPRIM) 300 MG tablet Take 1.5 tablets (450 mg total) by mouth daily. 06/10/21   BGlean Hess MD  amLODipine (NORVASC) 2.5 MG tablet Take 2.5 mg by mouth daily. 08/08/21   [provider]  aspirin EC 81 MG tablet Take 81 mg by mouth daily.    [provider]  b complex vitamins tablet Take 1 tablet by mouth daily.    [provider]  gabapentin (NEURONTIN) 100 MG capsule Take 100 mg by mouth  4 (four) times daily. 12/07/21   [provider]  GINKGO BILOBA PO Take 1 tablet by mouth daily.    [provider]  GLUCOSAMINE HCL PO Take 1,500 mg by mouth daily.  09/26/10   [provider]  HYDROcodone-acetaminophen (NORCO) 10-325 MG tablet Take 1 tablet by mouth 2 (two) times daily as needed. 10/03/20   [provider]  losartan (COZAAR) 100 MG tablet Take 1 tablet (100 mg total) by mouth daily. 06/10/21   Glean Hess, MD  meloxicam (MOBIC) 7.5 MG tablet Take 7.5-15 mg by mouth daily as needed.     [provider]  metoprolol succinate (TOPROL-XL) 25 MG 24 hr tablet TAKE 1 TABLET BY MOUTH EVERY DAY 02/10/22   Glean Hess, MD  Multiple Vitamin (MULTIVITAMIN WITH MINERALS) TABS tablet Take 1 tablet by mouth  daily.    [provider]  nitroGLYCERIN (NITROSTAT) 0.4 MG SL tablet Place 1 tablet (0.4 mg total) under the tongue every 5 (five) minutes as needed for chest pain. 06/05/20   Glean Hess, MD  NON FORMULARY CPAP @@ 10 cm H2O    [provider]  Omega-3 Fatty Acids (FISH OIL) 1200 MG CAPS Take 1,200 mg by mouth daily.     [provider]  Oxymetazoline HCl-Menthol (AFRIN MENTHOL SPRAY NA) Place into the nose at bedtime.    [provider]  pravastatin (PRAVACHOL) 20 MG tablet Take 20 mg by mouth daily.    [provider]    Family History Family History  Problem Relation Age of Onset   Rheum arthritis Mother    Rheumatic fever Father     Social History Social History   Tobacco Use   Smoking status: Never   Smokeless tobacco: Never  Vaping Use   Vaping Use: Never used  Substance Use Topics   Alcohol use: No    Alcohol/week: 0.0 standard drinks of alcohol   Drug use: No     Allergies   Crestor [rosuvastatin], Levofloxacin, Codeine, and Nortriptyline   Review of Systems Review of Systems  Musculoskeletal:  Negative for arthralgias.  Skin:  Positive for wound. Negative for color change.  Neurological:  Negative for weakness and numbness.     Physical Exam Triage Vital Signs ED Triage Vitals  Enc Vitals Group     BP      Pulse      Resp      Temp      Temp src      SpO2      Weight      Height      Head Circumference      Peak Flow      Pain Score      Pain Loc      Pain Edu?      Excl. in [place]?    No data found.  Updated Vital Signs BP 133/83 (BP Location: Right Arm)   Pulse 64   Temp 98.2 F (36.8 C) (Oral)   Resp 16   SpO2 99%    Physical Exam Vitals and nursing note reviewed.  Constitutional:      General: He is not in acute distress.    Appearance: Normal appearance. He is well-developed. He is not ill-appearing.  HENT:     Head: Normocephalic and atraumatic.  Eyes:     General: No scleral  icterus.    Conjunctiva/sclera: Conjunctivae normal.  Cardiovascular:     Rate and Rhythm: Normal rate.  Pulses: Normal pulses.  Pulmonary:     Effort: Pulmonary effort is normal. No respiratory distress.  Musculoskeletal:     Cervical back: Neck supple.  Skin:    General: Skin is warm and dry.     Capillary Refill: Capillary refill takes less than 2 seconds.     Comments: 1 cm laceration of the palmar aspect of the left fifth digit.  Large amount of dark-colored debris of the wound.  This was the clotting powder he applied.  After thoroughly flushing the wound, it is not actively bleeding and wound appears to be relatively superficial.  He has some tenderness in this area but full range of motion of finger and good pulses and strength.  Neurological:     General: No focal deficit present.     Mental Status: He is alert. Mental status is at baseline.     Motor: No weakness.  Psychiatric:        Mood and Affect: Mood normal.      UC Treatments / Results  Labs (all labs ordered are listed, but only abnormal results are displayed) Labs Reviewed - No data to display  EKG   Radiology No results found.  Procedures Procedures (including critical care time)  Laceration repair: 1 cm laceration of the left fifth finger.  Area thoroughly flushed with normal saline under pressure.  Scrubbed wound.  Patient gives consent for laceration repair.  Applied Dermabond.  After Dermabond dried, applied nonadherent pad and Coban.  He tolerated this well.  Medications Ordered in UC Medications - No data to display  Initial Impression / Assessment and Plan / UC Course  I have reviewed the triage vital signs and the nursing notes.  Pertinent labs & imaging results that were available during my care of the patient were reviewed by me and considered in my medical decision making (see chart for details).   66 year old male presenting for laceration of the left fifth digit that occurred a couple  of hours ago when he cut his finger with a fillet knife.  He applied clotting powder to the wound.  This is presently in the wound and he comes to the urgent care.  Area thoroughly flushed and cleaned with normal saline and antiseptic.  Tolerated this well.  Wound is about a centimeter long, linear, and of the palmar aspect of the fifth digit over the PIP joint. It is mildly tender.  Full range of motion of finger.  He gives consent for laceration repair so after the wound was thoroughly flushed and cleaned, applied Dermabond and covered with nonadherent pad and Coban.  He tolerated this well.  I did offer a splint but he declined.  Reviewed wound care guidelines.  Reviewed following up if there is signs of infection.  Follow-up as needed.  Final Clinical Impressions(s) / UC Diagnoses   Final diagnoses:  Laceration of left little finger without foreign body without damage to nail, initial encounter     Discharge Instructions      -We have cleaned the wound well.  We have repaired the wound with Dermabond.  Do not get this wet or it can ruin the integrity of the skin glue and it can come off sooner. - Wear gloves if you are going to get your hand wet. - Change your bandage daily. - If you experience any increased swelling, redness or increased pain in the finger, please return for reevaluation to check for infection.     ED Prescriptions   None  PDMP not reviewed this encounter.   Danton Clap, PA-C 05/12/22 1959

## 2022-05-12 NOTE — ED Triage Notes (Signed)
Pt cut his left pinky with a knife appx 2 hrs ago. Last tetanus 08/28/2018

## 2022-05-12 NOTE — Discharge Instructions (Addendum)
-  We have cleaned the wound well.  We have repaired the wound with Dermabond.  Do not get this wet or it can ruin the integrity of the skin glue and it can come off sooner. - Wear gloves if you are going to get your hand wet. - Change your bandage daily. - If you experience any increased swelling, redness or increased pain in the finger, please return for reevaluation to check for infection.

## 2022-06-12 ENCOUNTER — Other Ambulatory Visit: Payer: Self-pay | Admitting: Internal Medicine

## 2022-06-12 ENCOUNTER — Encounter: Payer: Medicare Other | Admitting: Internal Medicine

## 2022-06-12 DIAGNOSIS — I10 Essential (primary) hypertension: Secondary | ICD-10-CM

## 2022-06-12 NOTE — Telephone Encounter (Signed)
Requested medication (s) are due for refill today:   Yes  Requested medication (s) are on the active medication list:   Yes  Future visit scheduled:   No   Appt. Was cancelled per chart due to unhappy so changed providers.  Looks like he is established with Dr. Doy Hutching.   Last ordered: 06/10/2022 #90, 3 refills  Returned for provider review  to verify he is no longer a pt.     Requested Prescriptions  Pending Prescriptions Disp Refills   losartan (COZAAR) 100 MG tablet [Pharmacy Med Name: LOSARTAN POTASSIUM 100 MG TAB] 90 tablet 2    Sig: TAKE 1 TABLET BY MOUTH EVERY DAY     Cardiovascular:  Angiotensin Receptor Blockers Failed - 06/12/2022  2:38 AM      Failed - Cr in normal range and within 180 days    Creatinine  Date Value Ref Range Status  08/08/2021 1.5 (A) 0.6 - 1.3 Final   Creatinine, Ser  Date Value Ref Range Status  06/10/2021 1.78 (H) 0.76 - 1.27 mg/dL Final         Failed - K in normal range and within 180 days    Potassium  Date Value Ref Range Status  08/08/2021 4.3 3.5 - 5.1 mEq/L Final         Failed - Valid encounter within last 6 months    Recent Outpatient Visits           6 months ago Essential (primary) hypertension   [place] [facility], Jesse Sans, MD   1 year ago Annual physical exam   [place] [facility] at [facility], Jesse Sans, MD   1 year ago Essential (primary) hypertension   [place] [facility] at [facility], Jesse Sans, MD   2 years ago Annual physical exam   [place] [facility] at [facility], Jesse Sans, MD   2 years ago Left lower quadrant abdominal tenderness without rebound tenderness    [facility] Medicine at [facility], Jesse Sans, MD              Passed - Patient is not pregnant      Passed - Last BP in normal range    BP Readings  from Last 1 Encounters:  05/12/22 133/83

## 2022-06-13 ENCOUNTER — Encounter: Admission: RE | Payer: Self-pay | Source: Home / Self Care

## 2022-06-13 ENCOUNTER — Ambulatory Visit: Admission: RE | Admit: 2022-06-13 | Payer: Medicare Other | Source: Home / Self Care | Admitting: Gastroenterology

## 2022-06-13 SURGERY — COLONOSCOPY WITH PROPOFOL
Anesthesia: General

## 2022-10-09 ENCOUNTER — Other Ambulatory Visit: Payer: Self-pay | Admitting: Internal Medicine

## 2022-10-09 DIAGNOSIS — I10 Essential (primary) hypertension: Secondary | ICD-10-CM

## 2022-10-09 NOTE — Telephone Encounter (Signed)
Requested by interface surescripts. Provided not at this practice.  Requested Prescriptions  Refused Prescriptions Disp Refills   metoprolol succinate (TOPROL-XL) 25 MG 24 hr tablet [Pharmacy Med Name: METOPROLOL SUCC ER 25 MG TAB] 90 tablet 1    Sig: TAKE 1 TABLET BY MOUTH EVERY DAY     Cardiovascular:  Beta Blockers Failed - 10/09/2022  1:55 AM      Failed - Valid encounter within last 6 months    Recent Outpatient Visits           10 months ago Essential (primary) hypertension   [place] [facility] at [facility], Jesse Sans, MD   1 year ago Annual physical exam   [facility] at [facility], Jesse Sans, MD   1 year ago Essential (primary) hypertension   [place] [facility] at [facility], Jesse Sans, MD   2 years ago Annual physical exam   [place] [facility] at [facility], Jesse Sans, MD   2 years ago Left lower quadrant abdominal tenderness without rebound tenderness   [place] [facility] at [facility], Jesse Sans, MD              Passed - Last BP in normal range    BP Readings from Last 1 Encounters:  05/12/22 133/83         Passed - Last Heart Rate in normal range    Pulse Readings from Last 1 Encounters:  05/12/22 64

## 2022-11-07 ENCOUNTER — Other Ambulatory Visit: Payer: Self-pay | Admitting: Nephrology

## 2022-11-07 DIAGNOSIS — E785 Hyperlipidemia, unspecified: Secondary | ICD-10-CM

## 2022-11-07 DIAGNOSIS — N1832 Chronic kidney disease, stage 3b: Secondary | ICD-10-CM

## 2022-11-07 DIAGNOSIS — R8290 Unspecified abnormal findings in urine: Secondary | ICD-10-CM

## 2022-11-13 ENCOUNTER — Ambulatory Visit
Admission: RE | Admit: 2022-11-13 | Discharge: 2022-11-13 | Disposition: A | Discharge status: Home / Self Care | Payer: Medicare PPO | Source: Ambulatory Visit | Attending: Nephrology | Admitting: Nephrology

## 2022-11-13 DIAGNOSIS — N1832 Chronic kidney disease, stage 3b: Secondary | ICD-10-CM | POA: Diagnosis present

## 2022-11-13 DIAGNOSIS — R8290 Unspecified abnormal findings in urine: Secondary | ICD-10-CM | POA: Insufficient documentation

## 2022-11-13 DIAGNOSIS — E785 Hyperlipidemia, unspecified: Secondary | ICD-10-CM | POA: Diagnosis present

## 2023-06-29 ENCOUNTER — Other Ambulatory Visit: Payer: Self-pay | Admitting: Physician Assistant

## 2023-06-29 ENCOUNTER — Ambulatory Visit
Admission: RE | Admit: 2023-06-29 | Discharge: 2023-06-29 | Disposition: A | Discharge status: Home / Self Care | Payer: Medicare PPO | Source: Ambulatory Visit | Attending: Physician Assistant | Admitting: Physician Assistant

## 2023-06-29 DIAGNOSIS — M7989 Other specified soft tissue disorders: Secondary | ICD-10-CM | POA: Insufficient documentation

## 2023-07-15 ENCOUNTER — Emergency Department (HOSPITAL_COMMUNITY)
Admission: EM | Admit: 2023-07-15 | Discharge: 2023-07-16 | Disposition: A | Discharge status: Home / Self Care | Payer: Medicare PPO | Attending: Emergency Medicine | Admitting: Emergency Medicine

## 2023-07-15 ENCOUNTER — Other Ambulatory Visit: Payer: Self-pay

## 2023-07-15 ENCOUNTER — Emergency Department (HOSPITAL_COMMUNITY): Payer: Medicare PPO

## 2023-07-15 DIAGNOSIS — Z7982 Long term (current) use of aspirin: Secondary | ICD-10-CM | POA: Insufficient documentation

## 2023-07-15 DIAGNOSIS — I2510 Atherosclerotic heart disease of native coronary artery without angina pectoris: Secondary | ICD-10-CM | POA: Diagnosis not present

## 2023-07-15 DIAGNOSIS — I10 Essential (primary) hypertension: Secondary | ICD-10-CM | POA: Diagnosis not present

## 2023-07-15 DIAGNOSIS — S80812A Abrasion, left lower leg, initial encounter: Secondary | ICD-10-CM | POA: Diagnosis not present

## 2023-07-15 DIAGNOSIS — Z79899 Other long term (current) drug therapy: Secondary | ICD-10-CM | POA: Diagnosis not present

## 2023-07-15 DIAGNOSIS — T1490XA Injury, unspecified, initial encounter: Secondary | ICD-10-CM

## 2023-07-15 DIAGNOSIS — M25462 Effusion, left knee: Secondary | ICD-10-CM | POA: Diagnosis not present

## 2023-07-15 DIAGNOSIS — M79662 Pain in left lower leg: Secondary | ICD-10-CM | POA: Diagnosis present

## 2023-07-15 LAB — CBC
HCT: 37.7 % — ABNORMAL LOW (ref 39.0–52.0)
Hemoglobin: 12.2 g/dL — ABNORMAL LOW (ref 13.0–17.0)
MCH: 31.5 pg (ref 26.0–34.0)
MCHC: 32.4 g/dL (ref 30.0–36.0)
MCV: 97.4 fL (ref 80.0–100.0)
Platelets: 189 10*3/uL (ref 150–400)
RBC: 3.87 MIL/uL — ABNORMAL LOW (ref 4.22–5.81)
RDW: 12.9 % (ref 11.5–15.5)
WBC: 15.4 10*3/uL — ABNORMAL HIGH (ref 4.0–10.5)
nRBC: 0 % (ref 0.0–0.2)

## 2023-07-15 LAB — COMPREHENSIVE METABOLIC PANEL
ALT: 16 U/L (ref 0–44)
AST: 26 U/L (ref 15–41)
Albumin: 4.1 g/dL (ref 3.5–5.0)
Alkaline Phosphatase: 60 U/L (ref 38–126)
Anion gap: 12 (ref 5–15)
BUN: 34 mg/dL — ABNORMAL HIGH (ref 8–23)
CO2: 20 mmol/L — ABNORMAL LOW (ref 22–32)
Calcium: 9.3 mg/dL (ref 8.9–10.3)
Chloride: 110 mmol/L (ref 98–111)
Creatinine, Ser: 2.09 mg/dL — ABNORMAL HIGH (ref 0.61–1.24)
GFR, Estimated: 34 mL/min — ABNORMAL LOW (ref >60)
Glucose, Bld: 126 mg/dL — ABNORMAL HIGH (ref 70–99)
Potassium: 4.3 mmol/L (ref 3.5–5.1)
Sodium: 142 mmol/L (ref 135–145)
Total Bilirubin: 0.6 mg/dL (ref <1.2)
Total Protein: 6.6 g/dL (ref 6.5–8.1)

## 2023-07-15 LAB — I-STAT CHEM 8, ED
BUN: 33 mg/dL — ABNORMAL HIGH (ref 8–23)
Calcium, Ion: 1.14 mmol/L — ABNORMAL LOW (ref 1.15–1.40)
Chloride: 111 mmol/L (ref 98–111)
Creatinine, Ser: 2.1 mg/dL — ABNORMAL HIGH (ref 0.61–1.24)
Glucose, Bld: 120 mg/dL — ABNORMAL HIGH (ref 70–99)
HCT: 33 % — ABNORMAL LOW (ref 39.0–52.0)
Hemoglobin: 11.2 g/dL — ABNORMAL LOW (ref 13.0–17.0)
Potassium: 4.1 mmol/L (ref 3.5–5.1)
Sodium: 144 mmol/L (ref 135–145)
TCO2: 20 mmol/L — ABNORMAL LOW (ref 22–32)

## 2023-07-15 LAB — TROPONIN I (HIGH SENSITIVITY): Troponin I (High Sensitivity): 5 ng/L (ref <18)

## 2023-07-15 LAB — I-STAT CG4 LACTIC ACID, ED: Lactic Acid, Venous: 2.5 mmol/L (ref 0.5–1.9)

## 2023-07-15 LAB — PROTIME-INR
INR: 1.8 — ABNORMAL HIGH (ref 0.8–1.2)
Prothrombin Time: 21.5 s — ABNORMAL HIGH (ref 11.4–15.2)

## 2023-07-15 LAB — ETHANOL: Alcohol, Ethyl (B): <10 mg/dL (ref <10)

## 2023-07-15 MED ORDER — SODIUM CHLORIDE 0.9 % IV BOLUS
1000.0000 mL | Freq: Once | INTRAVENOUS | Status: AC
  Administered 2023-07-15: 1000 mL via INTRAVENOUS
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
## 2023-07-15 NOTE — Discharge Instructions (Addendum)
You were seen today in the emergency department.  Your x-rays did not show any fractures.  Your lab work did show slightly elevated INR which is a measure of liver function.  I recommend you have this rechecked by your doctor.  If you develop severe pain, numbness, severe swelling or any other new concerning symptoms you should return to the ED.  You should follow-up closely with your regular doctor.
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
## 2023-10-14 ENCOUNTER — Other Ambulatory Visit: Payer: Self-pay | Admitting: Rheumatology

## 2023-10-14 DIAGNOSIS — M150 Primary generalized (osteo)arthritis: Secondary | ICD-10-CM

## 2023-10-14 DIAGNOSIS — G8929 Other chronic pain: Secondary | ICD-10-CM

## 2023-11-04 ENCOUNTER — Ambulatory Visit
Admission: RE | Admit: 2023-11-04 | Discharge: 2023-11-04 | Disposition: A | Discharge status: Home / Self Care | Source: Ambulatory Visit | Attending: Rheumatology | Admitting: Rheumatology

## 2023-11-04 DIAGNOSIS — G8929 Other chronic pain: Secondary | ICD-10-CM | POA: Diagnosis present

## 2023-11-04 DIAGNOSIS — M150 Primary generalized (osteo)arthritis: Secondary | ICD-10-CM | POA: Insufficient documentation

## 2023-11-04 DIAGNOSIS — M25571 Pain in right ankle and joints of right foot: Secondary | ICD-10-CM | POA: Insufficient documentation

## 2024-04-07 ENCOUNTER — Other Ambulatory Visit: Payer: Self-pay | Admitting: Internal Medicine

## 2024-04-07 DIAGNOSIS — N183 Chronic kidney disease, stage 3 unspecified: Secondary | ICD-10-CM

## 2024-04-14 ENCOUNTER — Ambulatory Visit
Admission: RE | Admit: 2024-04-14 | Discharge: 2024-04-14 | Disposition: A | Discharge status: Home / Self Care | Source: Ambulatory Visit | Attending: Internal Medicine | Admitting: Internal Medicine

## 2024-04-14 DIAGNOSIS — N183 Chronic kidney disease, stage 3 unspecified: Secondary | ICD-10-CM | POA: Insufficient documentation

## 2024-06-07 ENCOUNTER — Telehealth: Payer: Self-pay | Admitting: Neurosurgery

## 2024-06-07 NOTE — Telephone Encounter (Signed)
 Left a message for patient to return call. I have several discs with images that do not have reports and 1 disk that says anonymous with no identifiers, we will need a new disk for this image.

## 2024-06-07 NOTE — Telephone Encounter (Signed)
 Patient's wife dropped off imaging discs and notes for new patient appointment with Dr. Katrina scheduled for 06/21/24@ 4:00.

## 2024-06-07 NOTE — Telephone Encounter (Signed)
 I spoke to patient and he is going to come by the office and sign a release of information form so we can request the information needed.

## 2024-06-14 ENCOUNTER — Encounter: Payer: Self-pay | Admitting: Neurosurgery

## 2024-06-14 ENCOUNTER — Encounter: Payer: Self-pay | Admitting: Family Medicine

## 2024-06-14 NOTE — Telephone Encounter (Signed)
 Patient came in on 06/09/2024 and signed a release to get the reports for images on disk and a new disk for one that says anonymous.

## 2024-06-17 ENCOUNTER — Inpatient Hospital Stay
Admission: RE | Admit: 2024-06-17 | Discharge: 2024-06-17 | Disposition: A | Discharge status: Home / Self Care | Payer: Self-pay | Source: Ambulatory Visit | Attending: Neurosurgery | Admitting: Neurosurgery

## 2024-06-17 ENCOUNTER — Other Ambulatory Visit: Payer: Self-pay | Admitting: Family Medicine

## 2024-06-17 DIAGNOSIS — M545 Low back pain, unspecified: Secondary | ICD-10-CM

## 2024-06-17 DIAGNOSIS — Z049 Encounter for examination and observation for unspecified reason: Secondary | ICD-10-CM

## 2024-06-17 NOTE — Progress Notes (Signed)
 Referring Physician:  Auston Reyes BIRCH, MD [address] [facility] [place],  [place] [address]  Primary Physician:  Auston Reyes BIRCH, MD  History of Present Illness: 06/21/2024 [name] is here today with a chief complaint of neuropathy who presents with worsening symptoms in his feet.  He experiences worsening neuropathy symptoms in his feet, particularly at night, described as tingling and a sensation of ants crawling, which disrupts his sleep. This improves when he sits up. During the day, the tingling is present but less severe. His feet are numb to the extent that he can walk barefoot on snow without feeling it. 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 Despite being on gabapentin  for several years, his symptoms have not improved.  He experiences intermittent muscle spasms in his toes and the outside of his foot, lasting for 24 hours and occurring every 15 seconds, though these have not occurred in the past month. O  He presents today to discuss his options.  He has previously followed with the neurologist clinic.  Bowel/Bladder Dysfunction: none  Conservative measures:  Physical therapy: has not participated in Multimodal medical therapy including regular antiinflammatories:  gabapentin , hydrocodone , meloxicam ,  Injections: has had epidural steroid injections  Past Surgery:  2017: ACDF by Dr. Samule 11/18/2022-S/P L3-S1 Lumbar laminectomy/decompression with Dr. Deatra Carlin LELON Reasoner has no symptoms of cervical myelopathy.  The symptoms are causing a significant impact on the patient's life.   I have utilized the care everywhere function in epic to review the outside records available from external health systems.   Review of Systems:  A 10 point review of systems is negative, except for the pertinent positives and negatives detailed in the HPI.  Past  Medical History: Past Medical History:  Diagnosis Date   Arthritis    Chronic neck pain    Coronary artery disease    Difficult intubation    Gout    H/O heart artery stent    History of hiatal hernia    Hypertension    Hypertension    Nerve pain    idiopathic peripheral neuropathy   Sleep apnea    Sleep apnea with use of continuous positive airway pressure (CPAP)     Past Surgical History: Past Surgical History:  Procedure Laterality Date   ANTERIOR FUSION CERVICAL SPINE  01/2016   BILATERAL CARPAL TUNNEL RELEASE  09/2013   COLONOSCOPY  08/14/2010   TA - rec 3 yr follow up ([facility])   CORONARY ANGIOPLASTY WITH STENT PLACEMENT  020112   [facility]   CORONARY ANGIOPLASTY WITH STENT PLACEMENT  06/05/2017   PCI of LCx with placement of 4.0 x 15mm DES   DISTAL BICEPS TENDON REPAIR Right 03/30/2017   Procedure: DISTAL BICEPS TENDON REPAIR;  Surgeon: Cleotilde Barrio, MD;  Location: [facility] ORS;  Service: Orthopedics;  Laterality: Right;   HYDROCELE EXCISION     VASECTOMY      Allergies: Allergies as of 06/21/2024 - Review Complete 06/21/2024  Allergen Reaction Noted   Crestor [rosuvastatin] Other (See Comments) 10/30/2020   Levofloxacin  Other (See Comments) 10/10/2015   Codeine  Itching and Rash 11/20/2014   Nortriptyline Other (See Comments) 10/30/2020    Medications:  Current Outpatient Medications:    allopurinol  (ZYLOPRIM ) 300 MG tablet, Take 1.5 tablets (450 mg total) by mouth daily., Disp: 90 tablet, Rfl: 1   amLODipine (NORVASC) 2.5 MG tablet, Take 2.5 mg  by mouth daily., Disp: , Rfl:    aspirin EC 81 MG tablet, Take 81 mg by mouth daily., Disp: , Rfl:    b complex vitamins tablet, Take 1 tablet by mouth daily., Disp: , Rfl:    ezetimibe (ZETIA) 10 MG tablet, Take 10 mg by mouth daily., Disp: , Rfl:    gabapentin  (NEURONTIN ) 100 MG capsule, Take 100 mg by mouth 4 (four) times daily., Disp: , Rfl:    GINKGO BILOBA PO, Take 1 tablet by mouth daily., Disp: , Rfl:    GLUCOSAMINE  HCL PO, Take 1,500 mg by mouth daily. , Disp: , Rfl:    HYDROcodone -acetaminophen  (NORCO) 10-325 MG tablet, Take 1 tablet by mouth 2 (two) times daily as needed., Disp: , Rfl:    losartan  (COZAAR ) 100 MG tablet, Take 1 tablet (100 mg total) by mouth daily., Disp: 90 tablet, Rfl: 3   meloxicam  (MOBIC ) 7.5 MG tablet, Take 7.5-15 mg by mouth daily as needed. , Disp: , Rfl:    metoprolol  succinate (TOPROL -XL) 25 MG 24 hr tablet, TAKE 1 TABLET BY MOUTH EVERY DAY, Disp: 90 tablet, Rfl: 1   Multiple Vitamin (MULTIVITAMIN WITH MINERALS) TABS tablet, Take 1 tablet by mouth daily., Disp: , Rfl:    nitroGLYCERIN  (NITROSTAT ) 0.4 MG SL tablet, Place 1 tablet (0.4 mg total) under the tongue every 5 (five) minutes as needed for chest pain., Disp: 30 tablet, Rfl: 0   NON FORMULARY, CPAP @@ 10 cm H2O, Disp: , Rfl:    Omega-3 Fatty Acids (FISH OIL) 1200 MG CAPS, Take 1,200 mg by mouth daily. , Disp: , Rfl:    Oxymetazoline HCl-Menthol (AFRIN MENTHOL SPRAY NA), Place into the nose at bedtime., Disp: , Rfl:    pravastatin  (PRAVACHOL ) 20 MG tablet, Take 20 mg by mouth daily., Disp: , Rfl:   Social History: Social History   Tobacco Use   Smoking status: Never   Smokeless tobacco: Never  Vaping Use   Vaping status: Never Used  Substance Use Topics   Alcohol use: No    Alcohol/week: 0.0 standard drinks of alcohol   Drug use: No    Family Medical History: Family History  Problem Relation Age of Onset   Rheum arthritis Mother    Rheumatic fever Father     Physical Examination: Vitals:   06/21/24 1550  BP: (!) 146/72    General: Patient is in no apparent distress. Attention to examination is appropriate.  Neck:   Supple.  Full range of motion.  Respiratory: Patient is breathing without any difficulty.   NEUROLOGICAL:     Awake, alert, oriented to person, place, and time.  Speech is clear and fluent.   Cranial Nerves: Pupils equal round and reactive to light.  Facial tone is symmetric.  Facial  sensation is symmetric. Shoulder shrug is symmetric. Tongue protrusion is midline.  There is no pronator drift.  Strength: Side Biceps Triceps Deltoid Interossei Grip Wrist Ext. Wrist Flex.  R 5 5 5 5 5 5 5   L 5 5 5 5 5 5 5    Side Iliopsoas Quads Hamstring PF DF EHL  R 5 5 5 5 5 5   L 5 5 5 5 5 5    Reflexes are 1+ and symmetric at the biceps, triceps, brachioradialis, patella and achilles.   Hoffman's is absent.   Bilateral upper and lower extremity sensation is symmetric to light touch.   He has altered sensation from just above his ankles through his feet and on the palmar  aspect of his hands No evidence of dysmetria noted.     Medical Decision Making  Imaging: Prior imaging reviewed  I have personally reviewed the images and agree with the above interpretation.  Assessment and Plan: Brian Payne is a pleasant 68 y.o. male with idiopathic neuropathy.  He has previously had both cervical spine surgery and lumbar spine surgery.  He had certain symptoms which his low back surgery helped, but he has continued to have neuropathy which impacts him on a daily basis particularly at nighttime.  I think he should consider spinal cord stimulation to try to help with his neuropathic symptoms.  We discussed getting thoracic and cervical MRI scans, psychology referral, and referral to pain clinic for spinal cord stimulator evaluation.  He will think about this.  I will also reach out to one of the neurologists so that he can discuss his neuropathy with him.  I spent a total of 40 minutes in this patient's care today. This time was spent reviewing pertinent records including imaging studies, obtaining and confirming history, performing a directed evaluation, formulating and discussing my recommendations, and documenting the visit within the medical record.      Thank you for involving me in the care of this patient.      Aiyden Lauderback K. Clois MD, [facility]

## 2024-06-17 NOTE — Telephone Encounter (Signed)
 Disc has been received and the report is in media. We still have not received notes but per Dr. GEARLINE he will see the patient. He will need x rays prior to appointment. I have scheduled and ordered the x ray and left a message on his voicemail to come in 30 minutes prior to the appointment to have the x ray.

## 2024-06-21 ENCOUNTER — Ambulatory Visit (INDEPENDENT_AMBULATORY_CARE_PROVIDER_SITE_OTHER)

## 2024-06-21 ENCOUNTER — Ambulatory Visit: Admitting: Neurosurgery

## 2024-06-21 VITALS — BP 146/72 | Ht 72.0 in | Wt 241.1 lb

## 2024-06-21 DIAGNOSIS — M545 Low back pain, unspecified: Secondary | ICD-10-CM | POA: Diagnosis not present

## 2024-06-21 DIAGNOSIS — M961 Postlaminectomy syndrome, not elsewhere classified: Secondary | ICD-10-CM | POA: Diagnosis not present

## 2024-06-21 DIAGNOSIS — G609 Hereditary and idiopathic neuropathy, unspecified: Secondary | ICD-10-CM | POA: Diagnosis not present

## 2024-06-24 ENCOUNTER — Inpatient Hospital Stay
Admission: RE | Admit: 2024-06-24 | Discharge: 2024-06-24 | Disposition: A | Discharge status: Home / Self Care | Payer: Self-pay | Source: Ambulatory Visit | Attending: Neurosurgery | Admitting: Neurosurgery

## 2024-06-24 ENCOUNTER — Other Ambulatory Visit: Payer: Self-pay

## 2024-06-24 DIAGNOSIS — Z049 Encounter for examination and observation for unspecified reason: Secondary | ICD-10-CM

## 2024-07-13 ENCOUNTER — Telehealth: Payer: Self-pay | Admitting: Neurosurgery

## 2024-07-13 DIAGNOSIS — M961 Postlaminectomy syndrome, not elsewhere classified: Secondary | ICD-10-CM

## 2024-07-13 DIAGNOSIS — M545 Low back pain, unspecified: Secondary | ICD-10-CM

## 2024-07-13 DIAGNOSIS — G609 Hereditary and idiopathic neuropathy, unspecified: Secondary | ICD-10-CM

## 2024-07-13 NOTE — Telephone Encounter (Signed)
 Pt wife left a voicemail in regards to scheduling an appointment with Dr.Y to discuss placement of the spinal cord stimulator. Please advise on how this needs to be scheduled or if it needs to be done with Dr.Y?

## 2024-07-13 NOTE — Telephone Encounter (Signed)
 Called Mrs Boyadjian, no answer, I called patient and confirmed that he was ready to proceed with getting SCS trial done. Advised per Dr Elliot office note we need to get MRI Thoracic and MRI cervical Spine done, referral to psychology and referral to pain management. Patient would like to go to [facility] in [place] for MRI scan as they have open MRI there, patient is claustrophobic and even with taking double dose of Valium before MRI in the past it did not help his anxiety. I sent him a list of psychologist and advised to let us  know who he preferred. Will work on referral to Pain management.

## 2024-07-14 NOTE — Telephone Encounter (Signed)
 Orders and referrals placed. Waiting for patient to let us  know which psychology office he wants to go to.

## 2024-07-19 NOTE — Telephone Encounter (Signed)
 Ritta can you re send and follow up on this for the patient please?

## 2024-07-29 ENCOUNTER — Encounter: Payer: Self-pay | Admitting: Neurosurgery

## 2024-07-29 DIAGNOSIS — M961 Postlaminectomy syndrome, not elsewhere classified: Secondary | ICD-10-CM

## 2024-08-01 MED ORDER — DIAZEPAM 5 MG PO TABS: ORAL_TABLET | ORAL | 0 refills | Status: DC

## 2024-08-01 NOTE — Telephone Encounter (Signed)
 Patient called back to follow up on his medication request via MyChart.

## 2024-08-01 NOTE — Telephone Encounter (Signed)
 PMP reviewed and is appropriate. Valium  sent for MRI.

## 2024-08-05 ENCOUNTER — Telehealth: Payer: Self-pay

## 2024-08-05 DIAGNOSIS — Z049 Encounter for examination and observation for unspecified reason: Secondary | ICD-10-CM

## 2024-08-05 NOTE — Telephone Encounter (Signed)
 Received reports from [facility] for MRI cervical and thoracic spine completed 08/02/24. Forwarded reports to Dr Clois for review. Dr Clois may want to see him to discuss based on the reports.  Sent powershare request to [facility], waiting on response.

## 2024-08-08 ENCOUNTER — Inpatient Hospital Stay
Admission: RE | Admit: 2024-08-08 | Discharge: 2024-08-08 | Disposition: A | Discharge status: Home / Self Care | Payer: Self-pay | Source: Ambulatory Visit | Attending: Neurosurgery | Admitting: Neurosurgery

## 2024-08-08 DIAGNOSIS — Z049 Encounter for examination and observation for unspecified reason: Secondary | ICD-10-CM

## 2024-08-08 NOTE — Telephone Encounter (Signed)
 Sent another powershare request to [employer]

## 2024-08-08 NOTE — Telephone Encounter (Signed)
 Called patient and scheduled follow up appt with Dr. Clois on 1/8 to discuss imaging results.

## 2024-08-08 NOTE — Telephone Encounter (Signed)
 Requests for imaging from [facility] have to be faxed over now, request faxed to [phone]

## 2024-08-11 ENCOUNTER — Encounter: Payer: Self-pay | Admitting: Neurosurgery

## 2024-08-11 ENCOUNTER — Ambulatory Visit: Admitting: Neurosurgery

## 2024-08-11 VITALS — BP 136/74 | Ht 72.0 in | Wt 241.0 lb

## 2024-08-11 DIAGNOSIS — M4804 Spinal stenosis, thoracic region: Secondary | ICD-10-CM

## 2024-08-11 DIAGNOSIS — M4714 Other spondylosis with myelopathy, thoracic region: Secondary | ICD-10-CM | POA: Diagnosis not present

## 2024-08-11 NOTE — Progress Notes (Signed)
 "   Referring Physician:  Auston Reyes BIRCH, MD [address] [facility] [place],  [place] [address]  Primary Physician:  Brian Reyes BIRCH, MD  History of Present Illness: 08/11/2024 [name] returns to see me.  His symptoms have not currently changed.  06/21/2024 [name] is here today with a chief complaint of neuropathy who presents with worsening symptoms in his feet.  He experiences worsening neuropathy symptoms in his feet, particularly at night, described as tingling and a sensation of ants crawling, which disrupts his sleep. This improves when he sits up. During the day, the tingling is present but less severe. His feet are numb to the extent that he can walk barefoot on snow without feeling it. 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 Despite being on gabapentin  for several years, his symptoms have not improved.  He experiences intermittent muscle spasms in his toes and the outside of his foot, lasting for 24 hours and occurring every 15 seconds, though these have not occurred in the past month. O  He presents today to discuss his options.  He has previously followed with the neurologist clinic.  Bowel/Bladder Dysfunction: none  Conservative measures:  Physical therapy: has not participated in Multimodal medical therapy including regular antiinflammatories:  gabapentin , hydrocodone , meloxicam ,  Injections: has had epidural steroid injections  Past Surgery:  2017: ACDF by Dr. Samule 11/18/2022-S/P L3-S1 Lumbar laminectomy/decompression with Dr. Deatra Carlin Brian Payne has no symptoms of cervical myelopathy.  The symptoms are causing a significant impact on the patient's life.   I have utilized the care everywhere function in epic to review the outside records available from external health systems.   Review of Systems:  A 10 point review of systems is  negative, except for the pertinent positives and negatives detailed in the HPI.  Past Medical History: Past Medical History:  Diagnosis Date   Arthritis    Chronic neck pain    Coronary artery disease    Difficult intubation    Gout    H/O heart artery stent    History of hiatal hernia    Hypertension    Hypertension    Nerve pain    idiopathic peripheral neuropathy   Sleep apnea    Sleep apnea with use of continuous positive airway pressure (CPAP)     Past Surgical History: Past Surgical History:  Procedure Laterality Date   ANTERIOR FUSION CERVICAL SPINE  01/2016   BILATERAL CARPAL TUNNEL RELEASE  09/2013   COLONOSCOPY  08/14/2010   TA - rec 3 yr follow up ([facility])   CORONARY ANGIOPLASTY WITH STENT PLACEMENT  020112   [facility]   CORONARY ANGIOPLASTY WITH STENT PLACEMENT  06/05/2017   PCI of LCx with placement of 4.0 x 15mm DES   DISTAL BICEPS TENDON REPAIR Right 03/30/2017   Procedure: DISTAL BICEPS TENDON REPAIR;  Surgeon: Cleotilde Barrio, MD;  Location: [facility] ORS;  Service: Orthopedics;  Laterality: Right;   HYDROCELE EXCISION     VASECTOMY      Allergies: Allergies as of 08/11/2024 - Review Complete 08/11/2024  Allergen Reaction Noted   Crestor [rosuvastatin] Other (See Comments) 10/30/2020   Levofloxacin  Other (See Comments) 10/10/2015   Codeine  Itching and Rash 11/20/2014   Nortriptyline Other (See Comments) 10/30/2020    Medications:  Current Outpatient Medications:    allopurinol  (ZYLOPRIM ) 300 MG tablet, Take 300 mg by mouth daily. Take 0.5  tablet daily., Disp: , Rfl:    amLODipine (NORVASC) 5 MG tablet, Take 5 mg by mouth daily., Disp: , Rfl:    aspirin EC 81 MG tablet, Take 81 mg by mouth daily., Disp: , Rfl:    b complex vitamins tablet, Take 1 tablet by mouth daily., Disp: , Rfl:    ezetimibe (ZETIA) 10 MG tablet, Take 10 mg by mouth daily., Disp: , Rfl:    gabapentin  (NEURONTIN ) 300 MG capsule, Take 300 mg by mouth 3 (three) times daily., Disp: , Rfl:     GINKGO BILOBA PO, Take 1 tablet by mouth daily., Disp: , Rfl:    GLUCOSAMINE HCL PO, Take 1,500 mg by mouth daily. , Disp: , Rfl:    HYDROcodone -acetaminophen  (NORCO) 10-325 MG tablet, Take 1 tablet by mouth 2 (two) times daily as needed., Disp: , Rfl:    losartan  (COZAAR ) 100 MG tablet, Take 1 tablet (100 mg total) by mouth daily., Disp: 90 tablet, Rfl: 3   metoprolol  succinate (TOPROL -XL) 25 MG 24 hr tablet, TAKE 1 TABLET BY MOUTH EVERY DAY, Disp: 90 tablet, Rfl: 1   nitroGLYCERIN  (NITROSTAT ) 0.4 MG SL tablet, Place 1 tablet (0.4 mg total) under the tongue every 5 (five) minutes as needed for chest pain., Disp: 30 tablet, Rfl: 0   NON FORMULARY, CPAP @@ 10 cm H2O, Disp: , Rfl:    Omega-3 Fatty Acids (FISH OIL) 1200 MG CAPS, Take 1,200 mg by mouth daily. , Disp: , Rfl:    Oxymetazoline HCl-Menthol (AFRIN MENTHOL SPRAY NA), Place into the nose at bedtime., Disp: , Rfl:    pravastatin  (PRAVACHOL ) 20 MG tablet, Take 20 mg by mouth daily., Disp: , Rfl:   Social History: Social History   Tobacco Use   Smoking status: Never   Smokeless tobacco: Never  Vaping Use   Vaping status: Never Used  Substance Use Topics   Alcohol use: No    Alcohol/week: 0.0 standard drinks of alcohol   Drug use: No    Family Medical History: Family History  Problem Relation Age of Onset   Rheum arthritis Mother    Rheumatic fever Father     Physical Examination: Vitals:   08/11/24 1037  BP: 136/74    General: Patient is in no apparent distress. Attention to examination is appropriate.  Neck:   Supple.  Full range of motion.  Respiratory: Patient is breathing without any difficulty.   NEUROLOGICAL:     Awake, alert, oriented to person, place, and time.  Speech is clear and fluent.   Cranial Nerves:  Cranial nerves appear intact.  He moves all extremities well.  He does note some imbalance.       Medical Decision Making  Imaging: MRI CT spine 08/02/2024 IMPRESSION:  1.  Satisfactory  C3-C7 ACDF. Moderate left proximal foraminal disc osteophyte complex at C3-4 compresses the exiting left C4 nerve root. Small right paracentral disc osteophyte complex at C5-6 abuts the ventral spinal cord and exiting C6 nerve root. Small right foraminal osteophyte at C6-7 compresses the right C7 nerve root.  2.  Severe multifactorial spinal and bilateral foraminal stenosis at T1-T2 with compression of the spinal cord and exiting T1 nerve roots. No definite myelopathy.  3.  Ankylosis of the right C2-3 facet joint.   Electronically Signed by: Prentice Clay, MD on 08/05/2024 2:07 PM  IMPRESSION:  1. Thoracic spondylosis consistent with DISH. Severe central canal and bilateral foraminal stenosis at T1-T2 with discogenic marrow edema. No cord signal abnormality.  2.  Right lateral mixed spondylotic changes in the mid thoracic spine with potential exiting nerve root compression.  3.  Complex right paratracheal/superior mediastinal mass of indeterminate etiology but possibly related to mediastinal goiter. Suggest CT of the chest with contrast for further evaluation.   Electronically Signed by: Prentice Clay, MD on 08/05/2024 2:26 PM     I have personally reviewed the images and agree with the above interpretation.  Assessment and Plan: Brian Payne is a pleasant 69 y.o. male with idiopathic neuropathy.  He has previously had both cervical spine surgery and lumbar spine surgery.   He has severe thoracic stenosis at T1-2.  He does have some imbalance and numbness in his legs which be secondary to Thoracic myelopathy.  We reviewed that I would recommend addressing this prior to consideration of a spinal cord stimulator evaluation.  This would require extension of his fusion from C7-T2 with laminectomy and facetectomy at T1-2.  I discussed the planned procedure at length with the patient, including the risks, benefits, alternatives, and indications. The risks discussed include but are not limited to bleeding,  infection, need for reoperation, spinal fluid leak, stroke, vision loss, anesthetic complication, coma, paralysis, and even death. I also described in detail that improvement was not guaranteed.  The patient expressed understanding of these risks. I described the surgery in layman's terms, and gave ample opportunity for questions, which were answered to the best of my ability.  He would like to think this over.  We also discussed spinal cord stimulation.  We addressed his questions as best as possible.  I spent a total of 45 minutes in this patient's care today. This time was spent reviewing pertinent records including imaging studies, obtaining and confirming history, performing a directed evaluation, formulating and discussing my recommendations, and documenting the visit within the medical record.    Thank you for involving me in the care of this patient.      Alaster Asfaw K. Clois MD, [facility] Neurosurgery  "

## 2024-08-12 ENCOUNTER — Telehealth: Payer: Self-pay | Admitting: Neurosurgery

## 2024-08-12 DIAGNOSIS — M4804 Spinal stenosis, thoracic region: Secondary | ICD-10-CM

## 2024-08-12 DIAGNOSIS — M4714 Other spondylosis with myelopathy, thoracic region: Secondary | ICD-10-CM

## 2024-08-12 NOTE — Telephone Encounter (Signed)
 Patient called to let our office know that he would like to proceed with surgery.

## 2024-08-15 ENCOUNTER — Other Ambulatory Visit: Payer: Self-pay

## 2024-08-15 DIAGNOSIS — Z01818 Encounter for other preprocedural examination: Secondary | ICD-10-CM

## 2024-08-15 DIAGNOSIS — M4714 Other spondylosis with myelopathy, thoracic region: Secondary | ICD-10-CM

## 2024-08-15 DIAGNOSIS — M4804 Spinal stenosis, thoracic region: Secondary | ICD-10-CM

## 2024-08-15 NOTE — Telephone Encounter (Signed)
 I spoke with Mr and Mrs Reichel. We discussed the information below.   Planned surgery: C7-T2 posterior spinal fusion, T1-2 posterior spinal decompression   Surgery date: 08/31/24 at [facility]: [address] [place], [place] [address]) - you will find out your arrival time the business day before your surgery.   Pre-op appointment at [facility] Pre-admit Testing: you will receive a call with a date/time for this appointment. If you are scheduled for an in person appointment, Pre-admit Testing is located on the first floor of the [facility] building, [address], [address]. During this appointment, they will advise you which medications you can take the morning of surgery, and which medications you will need to hold for surgery. Labs (such as blood work, EKG) may be done at your pre-op appointment. You are not required to fast for these labs. Should you need to change your pre-op appointment, please call Pre-admit testing at [phone].   Please bring your medication bottles or an up to date medication list to your pre-admit testing appointment (regardless of whether we have a list in your chart).   Blood thinners:   Aspirin 81mg : OK to stay on aspirin 81mg     Surgical clearance: we will send a clearance form to Brian Costa, MD. They may wish to see you in their office prior to signing the clearance form. If so, they may call you to schedule an appointment.    NSAIDS (Non-steroidal anti-inflammatory drugs): because you are having a fusion, please avoid taking any NSAIDS (examples: ibuprofen, motrin, aleve, naproxen, meloxicam , diclofenac) for 3 months after surgery. Celebrex is an exception and is OK to take, if prescribed. Tylenol  is not an NSAID.    Common restrictions after spine surgery: No bending, lifting, or twisting (BLT). Avoid lifting objects heavier than 10 pounds for the first 6 weeks after surgery. Where  possible, avoid household activities that involve lifting, bending, reaching, pushing, or pulling such as laundry, vacuuming, grocery shopping, and childcare. Try to arrange for help from friends and family for these activities while you heal. Do not drive while taking prescription pain medication. Weeks 6 through 12 after surgery: avoid lifting more than 25 pounds.    X-rays after surgery: Because you are having a fusion: for appointments after your 2 week follow-up: please arrive our office 30 minutes prior to your appointment for x-rays. This applies to every appointment after your 2 week follow-up. Failure to do so may result in your appointment being rescheduled.    How to contact us :  If you have any questions/concerns before or after surgery, you can reach us  at [phone], or you can send a mychart message. We can be reached by phone or mychart 8am-4pm, Monday-Friday.  *Please note: Calls after 4pm are forwarded to a third party answering service. Mychart messages are not routinely monitored during evenings, weekends, and holidays. Please call our office to contact the answering service for urgent concerns during non-business hours.   If you have FMLA/disability paperwork, please drop it off or fax it to [phone]   Appointments/FMLA & disability paperwork: Reche Hait, & Nichole Registered Nurse/Surgery scheduler: Orianna Biskup, RN & Katie, RN Certified Medical Assistants: Don, CMA, Elenor, CMA, Damien, CMA, & Auston, [place] Physician Assistants: Lyle Decamp, PA-C, Edsel Goods, PA-C & Glade Boys, PA-C Surgeons: Penne Sharps, MD & Reeves Daisy, MD   [facility] PREADMIT TESTING VISIT and SURGERY INFORMATION SHEET   Now that surgery has been scheduled  you can anticipate several phone calls from [facility]. A pharmacy technician will call you to verify your current list of medications taken at home.               The Pre-Service Center  will call to verify your insurance information and to give you billing estimates and information.             The Preadmit Testing Office will be calling to schedule a visit to obtain information for the anesthesia team and provide instructions on preparation for surgery.  What can you expect for the Preadmit Testing Visit: Appointments may be scheduled in-person or by telephone.  If a telephone visit is scheduled, you may be asked to come into the office to have lab tests or other studies performed.   This visit will not be completed any greater than 14 days prior to your surgery.  If your surgery has been scheduled for a future date, please do not be alarmed if we have not contacted you to schedule an appointment more than a month prior to the surgery date.    Please be prepared to provide the following information during this appointment:            -Personal medical history                                               -Medication and allergy list            -Any history of problems with anesthesia              -Recent lab work or diagnostic studies            -Please notify us  of any needs we should be aware of to provide the best care possible           -You will be provided with instructions on how to prepare for your surgery.    On The Day of Surgery:  You must have a driver to take you home after surgery, you will be asked not to drive for 24 hours following surgery.  Taxi, Gisele and non-medical transport will not be acceptable means of transportation unless you have a responsible individual who will be traveling with you.  Visitors in the surgical area:   2 people will be able to visit you in your room once your preparation for surgery has been completed. During surgery, your visitors will be asked to wait in the Surgery Waiting Area.  It is not a requirement for them to stay, if they prefer to leave and come back.  Your visitor(s) will be given an update once the surgery has been  completed.  No visitors are allowed in the initial recovery room to respect patient privacy and safety.  Once you are more awake and transfer to the secondary recovery area, or are transferred to an inpatient room, visitors will again be able to see you.  To respect and protect your privacy: We will ask on the day of surgery who your driver will be and what the contact number for that individual will be. We will ask if it is okay to share information with this individual, or if there is an alternative individual that we, or the surgeon, should contact to provide updates and information. If family or friends come to the surgical  information desk requesting information about you, who you have not listed with us , no information will be given.   It may be helpful to designate someone as the main contact who will be responsible for updating your other friends and family.    PREADMIT TESTING OFFICE: [phone] SAME DAY SURGERY: [phone] We look forward to caring for you before and throughout the process of your surgery.

## 2024-08-22 DIAGNOSIS — Z9889 Other specified postprocedural states: Secondary | ICD-10-CM

## 2024-08-22 HISTORY — DX: Other specified postprocedural states: Z98.890

## 2024-08-25 ENCOUNTER — Encounter: Payer: Self-pay | Admitting: Neurosurgery

## 2024-08-25 ENCOUNTER — Encounter
Admission: RE | Admit: 2024-08-25 | Discharge: 2024-08-25 | Disposition: A | Discharge status: Home / Self Care | Source: Ambulatory Visit | Attending: Neurosurgery | Admitting: Neurosurgery

## 2024-08-25 VITALS — BP 129/73 | HR 70 | Temp 98.1°F | Ht 72.0 in | Wt 241.8 lb

## 2024-08-25 DIAGNOSIS — M4804 Spinal stenosis, thoracic region: Secondary | ICD-10-CM | POA: Diagnosis not present

## 2024-08-25 DIAGNOSIS — Z01812 Encounter for preprocedural laboratory examination: Secondary | ICD-10-CM

## 2024-08-25 DIAGNOSIS — R9431 Abnormal electrocardiogram [ECG] [EKG]: Secondary | ICD-10-CM | POA: Insufficient documentation

## 2024-08-25 DIAGNOSIS — M5090 Cervical disc disorder, unspecified, unspecified cervical region: Secondary | ICD-10-CM | POA: Diagnosis not present

## 2024-08-25 DIAGNOSIS — I10 Essential (primary) hypertension: Secondary | ICD-10-CM | POA: Insufficient documentation

## 2024-08-25 DIAGNOSIS — I779 Disorder of arteries and arterioles, unspecified: Secondary | ICD-10-CM | POA: Insufficient documentation

## 2024-08-25 DIAGNOSIS — Z01818 Encounter for other preprocedural examination: Secondary | ICD-10-CM | POA: Insufficient documentation

## 2024-08-25 HISTORY — DX: Chronic kidney disease, unspecified: N18.9

## 2024-08-25 HISTORY — DX: Anemia, unspecified: D64.9

## 2024-08-25 HISTORY — DX: Claustrophobia: F40.240

## 2024-08-25 LAB — CBC
HCT: 34.6 % — ABNORMAL LOW (ref 39.0–52.0)
Hemoglobin: 11.5 g/dL — ABNORMAL LOW (ref 13.0–17.0)
MCH: 31.9 pg (ref 26.0–34.0)
MCHC: 33.2 g/dL (ref 30.0–36.0)
MCV: 95.8 fL (ref 80.0–100.0)
Platelets: 155 K/uL (ref 150–400)
RBC: 3.61 MIL/uL — ABNORMAL LOW (ref 4.22–5.81)
RDW: 12.9 % (ref 11.5–15.5)
WBC: 5.3 K/uL (ref 4.0–10.5)
nRBC: 0 % (ref 0.0–0.2)

## 2024-08-25 LAB — BASIC METABOLIC PANEL WITH GFR
Anion gap: 12 (ref 5–15)
BUN: 33 mg/dL — ABNORMAL HIGH (ref 8–23)
CO2: 22 mmol/L (ref 22–32)
Calcium: 9.7 mg/dL (ref 8.9–10.3)
Chloride: 109 mmol/L (ref 98–111)
Creatinine, Ser: 1.69 mg/dL — ABNORMAL HIGH (ref 0.61–1.24)
GFR, Estimated: 44 mL/min — ABNORMAL LOW
Glucose, Bld: 68 mg/dL — ABNORMAL LOW (ref 70–99)
Potassium: 4.6 mmol/L (ref 3.5–5.1)
Sodium: 143 mmol/L (ref 135–145)

## 2024-08-25 LAB — SURGICAL PCR SCREEN
MRSA, PCR: NEGATIVE
Staphylococcus aureus: NEGATIVE

## 2024-08-25 LAB — TYPE AND SCREEN
ABO/RH(D): A POS
Antibody Screen: NEGATIVE

## 2024-08-25 NOTE — Patient Instructions (Addendum)
 Your procedure is scheduled on: 08/31/2024 Report to the Registration Desk on the 1st floor of the [facility]. To find out your arrival time, please call [phone] between 1PM - 3PM on: Jan 27/2026  If your arrival time is 6:00 am, do not arrive before that time as the [facility] entrance doors do not open until 6:00 am.  REMEMBER: Instructions that are not followed completely may result in serious medical risk, up to and including death; or upon the discretion of your surgeon and anesthesiologist your surgery may need to be rescheduled.  Do not eat food after midnight the night before surgery.  No gum chewing or hard candies.  You may however, drink CLEAR liquids up to 2 hours before you are scheduled to arrive for your surgery. Do not drink anything within 2 hours of your scheduled arrival time.  Clear liquids include: - water  - apple juice without pulp - gatorade (not RED colors) - black coffee or tea (Do NOT add milk or creamers to the coffee or tea) Do NOT drink anything that is not on this list.    One week prior to surgery:  Stop ANY OVER THE COUNTER supplements until after surgery like Omega 3,glucosamine,  You may however, continue to take Tylenol  if needed for pain up until the day of surgery.  Follow recommendations from PCP or cardiologist regarding stopping blood thinners.   Continue taking all of your other prescription medications up until the day of surgery.  ON THE DAY OF SURGERY ONLY TAKE THESE MEDICATIONS WITH SIPS OF WATER:  allopurinol  (ZYLOPRIM )  amLODipine (NORVASC) ezetimibe (ZETIA)  gabapentin  (NEURONTIN ) HYDROcodone -acetaminophen  metoprolol  succinate  pravastatin  (PRAVACHOL )  No Alcohol for 24 hours before or after surgery.  No Smoking including e-cigarettes for 24 hours before surgery.  No chewable tobacco products for at least 6 hours before surgery.  No nicotine patches on the day of surgery.  Do not use any recreational drugs  for at least a week (preferably 2 weeks) before your surgery.  Please be advised that the combination of cocaine and anesthesia may have negative outcomes, up to and including death. If you test positive for cocaine, your surgery will be cancelled.  On the morning of surgery brush your teeth with toothpaste and water, you may rinse your mouth with mouthwash if you wish. Do not swallow any toothpaste or mouthwash.  Use CHG Soap or wipes as directed on instruction sheet.-Provided for you   Do not wear jewelry, make-up, hairpins, clips or nail polish.  For welded (permanent) jewelry: bracelets, anklets, waist bands, etc.  Please have this removed prior to surgery.  If it is not removed, there is a chance that hospital personnel will need to cut it off on the day of surgery.  Do not wear lotions, powders, or perfumes.   Do not shave body hair from the neck down 48 hours before surgery.  Contact lenses, hearing aids and dentures may not be worn into surgery.  Do not bring valuables to the hospital. [facility] is not responsible for any missing/lost belongings or valuables.    Bring your C-PAP to the hospital in case you may have to spend the night.   Notify your doctor if there is any change in your medical condition (cold, fever, infection).  Wear comfortable clothing (specific to your surgery type) to the hospital.  After surgery, you can help prevent lung complications by doing breathing exercises.  Take deep breaths and cough every 1-2 hours. Your  doctor may order a device called an Incentive Spirometer to help you take deep breaths.  If you are being admitted to the hospital overnight, leave your suitcase in the car. After surgery it may be brought to your room.  In case of increased patient census, it may be necessary for you, the patient, to continue your postoperative care in the Same Day Surgery department.  If you are being discharged the day of surgery, you will not be  allowed to drive home. You will need a responsible individual to drive you home and stay with you for 24 hours after surgery.   If you are taking public transportation, you will need to have a responsible individual with you.  Please call the Pre-admissions Testing Dept. at [phone] if you have any questions about these instructions.  Surgery Visitation Policy:  Patients having surgery or a procedure may have two visitors.  Children under the age of 10 must have an adult with them who is not the patient.  Inpatient Visitation:    Visiting hours are 7 a.m. to 8 p.m. Up to four visitors are allowed at one time in a patient room. The visitors may rotate out with other people during the day.  One visitor age 53 or older may stay with the patient overnight and must be in the room by 8 p.m.   Merchandiser, Retail to address health-related social needs:  [email or web address]     Pre-operative 4 CHG Bath Instructions   You can play a key role in reducing the risk of infection after surgery. Your skin needs to be as free of germs as possible. You can reduce the number of germs on your skin by washing with CHG (chlorhexidine  gluconate) soap before surgery. CHG is an antiseptic soap that kills germs and continues to kill germs even after washing.   DO NOT use if you have an allergy to chlorhexidine /CHG or antibacterial soaps. If your skin becomes reddened or irritated, stop using the CHG and notify one of our RNs at [phone].   Please shower with the CHG soap starting 4 days before surgery using the following schedule:          Please keep in mind the following:  DO NOT shave, including legs and underarms, starting the day of your first shower.   You may shave your face at any point before/day of surgery.  Place clean sheets on your bed the day you start using CHG soap. Use a clean washcloth (not used since being washed) for each shower. DO NOT sleep  with pets once you start using the CHG.   CHG Shower Instructions:  If you choose to wash your hair and private area, wash first with your normal shampoo/soap.  After you use shampoo/soap, rinse your hair and body thoroughly to remove shampoo/soap residue.  Turn the water OFF and apply about 3 tablespoons (45 ml) of CHG soap to a CLEAN washcloth.  Apply CHG soap ONLY FROM YOUR NECK DOWN TO YOUR TOES (washing for 3-5 minutes)  DO NOT use CHG soap on face, private areas, open wounds, or sores.  Pay special attention to the area where your surgery is being performed.  If you are having back surgery, having someone wash your back for you may be helpful. Wait 2 minutes after CHG soap is applied, then you may rinse off the CHG soap.  Pat dry with a clean towel  Put on clean clothes/pajamas   If you choose to wear lotion,  please use ONLY the CHG-compatible lotions on the back of this paper.     Additional instructions for the day of surgery: DO NOT APPLY any lotions, deodorants, cologne, or perfumes.   Put on clean/comfortable clothes.  Brush your teeth.  Ask your nurse before applying any prescription medications to the skin.      CHG Compatible Lotions   Aveeno Moisturizing lotion  Cetaphil Moisturizing Cream  Cetaphil Moisturizing Lotion  Clairol Herbal Essence Moisturizing Lotion, Dry Skin  Clairol Herbal Essence Moisturizing Lotion, Extra Dry Skin  Clairol Herbal Essence Moisturizing Lotion, Normal Skin  Curel Age Defying Therapeutic Moisturizing Lotion with Alpha Hydroxy  Curel Extreme Care Body Lotion  Curel Soothing Hands Moisturizing Hand Lotion  Curel Therapeutic Moisturizing Cream, Fragrance-Free  Curel Therapeutic Moisturizing Lotion, Fragrance-Free  Curel Therapeutic Moisturizing Lotion, Original Formula  Eucerin Daily Replenishing Lotion  Eucerin Dry Skin Therapy Plus Alpha Hydroxy Crme  Eucerin Dry Skin Therapy Plus Alpha Hydroxy Lotion  Eucerin Original Crme   Eucerin Original Lotion  Eucerin Plus Crme Eucerin Plus Lotion  Eucerin TriLipid Replenishing Lotion  Keri Anti-Bacterial Hand Lotion  Keri Deep Conditioning Original Lotion Dry Skin Formula Softly Scented  Keri Deep Conditioning Original Lotion, Fragrance Free Sensitive Skin Formula  Keri Lotion Fast Absorbing Fragrance Free Sensitive Skin Formula  Keri Lotion Fast Absorbing Softly Scented Dry Skin Formula  Keri Original Lotion  Keri Skin Renewal Lotion Keri Silky Smooth Lotion  Keri Silky Smooth Sensitive Skin Lotion  Nivea Body Creamy Conditioning Oil  Nivea Body Extra Enriched Teacher, Adult Education Moisturizing Lotion Nivea Crme  Nivea Skin Firming Lotion  NutraDerm 30 Skin Lotion  NutraDerm Skin Lotion  NutraDerm Therapeutic Skin Cream  NutraDerm Therapeutic Skin Lotion  ProShield Protective Hand Cream  Provon moisturizing lotion

## 2024-08-25 NOTE — Telephone Encounter (Signed)
 Patient stopped by the office today to inquire about a surgical information booklet. He states he was asked by pre-op if he had received it. Printed physical copy of surgery instructions and gave it to patient. Informed to call or send a message if he or his wife have any additional questions.

## 2024-08-25 NOTE — Patient Instructions (Signed)
 Your procedure is scheduled on: Report to the Registration Desk on the 1st floor of [facility]. To find out your arrival time, please call [phone] between 1PM - 3PM on: If your arrival time is 6:00 am, do not arrive before that time as the [facility] entrance doors do not open until 6:00 am.  REMEMBER: Instructions that are not followed completely may result in serious medical risk, up to and including death; or upon the discretion of your surgeon and anesthesiologist your surgery may need to be rescheduled.  Do not eat food after midnight the night before surgery.  No gum chewing or hard candies.  You may however, drink CLEAR liquids up to 2 hours before you are scheduled to arrive for your surgery. Do not drink anything within 2 hours of your scheduled arrival time.  Clear liquids include: - water  - apple juice without pulp - gatorade (not RED colors) - black coffee or tea (Do NOT add milk or creamers to the coffee or tea) Do NOT drink anything that is not on this list.  **Type 1 and Type 2 diabetics should only drink water.**  In addition, your doctor has ordered for you to drink the provided:  Ensure Pre-Surgery Clear Carbohydrate Drink  Gatorade G2 Drinking this carbohydrate drink up to two hours before surgery helps to reduce insulin resistance and improve patient outcomes. Please complete drinking 2 hours before scheduled arrival time.  One week prior to surgery: Stop Anti-inflammatories (NSAIDS) such as Advil, Aleve, Ibuprofen, Motrin, Naproxen, Naprosyn and Aspirin based products such as Excedrin, Goody's Powder, BC Powder. Stop ANY OVER THE COUNTER supplements until after surgery.  You may however, continue to take Tylenol  if needed for pain up until the day of surgery.  **Follow guidelines for insulin and diabetes medications.**  **Follow recommendations regarding stopping blood thinners.**  Continue taking all of your other prescription medications up  until the day of surgery.  ON THE DAY OF SURGERY ONLY TAKE THESE MEDICATIONS WITH SIPS OF WATER:    Use inhalers on the day of surgery and bring to the hospital.  Fleets enema or bowel prep as directed.  No Alcohol for 24 hours before or after surgery.  No Smoking including e-cigarettes for 24 hours before surgery.  No chewable tobacco products for at least 6 hours before surgery.  No nicotine patches on the day of surgery.  Do not use any recreational drugs for at least a week (preferably 2 weeks) before your surgery.  Please be advised that the combination of cocaine and anesthesia may have negative outcomes, up to and including death. If you test positive for cocaine, your surgery will be cancelled.  On the morning of surgery brush your teeth with toothpaste and water, you may rinse your mouth with mouthwash if you wish. Do not swallow any toothpaste or mouthwash.  Use CHG Soap or wipes as directed on instruction sheet.  Do not wear jewelry, make-up, hairpins, clips or nail polish.  For welded (permanent) jewelry: bracelets, anklets, waist bands, etc.  Please have this removed prior to surgery.  If it is not removed, there is a chance that hospital personnel will need to cut it off on the day of surgery.  Do not wear lotions, powders, or perfumes.   Do not shave body hair from the neck down 48 hours before surgery.  Contact lenses, hearing aids and dentures may not be worn into surgery.  Do not bring valuables to the hospital. [facility]  is not responsible for any missing/lost belongings or valuables.   Total Shoulder Arthroplasty:  use Benzoyl Peroxide 5% Gel as directed on instruction sheet.  Bring your C-PAP to the hospital in case you may have to spend the night.   Notify your doctor if there is any change in your medical condition (cold, fever, infection).  Wear comfortable clothing (specific to your surgery type) to the hospital.  After surgery, you can help  prevent lung complications by doing breathing exercises.  Take deep breaths and cough every 1-2 hours. Your doctor may order a device called an Incentive Spirometer to help you take deep breaths. When coughing or sneezing, hold a pillow firmly against your incision with both hands. This is called splinting. Doing this helps protect your incision. It also decreases belly discomfort.  If you are being admitted to the hospital overnight, leave your suitcase in the car. After surgery it may be brought to your room.  In case of increased patient census, it may be necessary for you, the patient, to continue your postoperative care in the Same Day Surgery department.  If you are being discharged the day of surgery, you will not be allowed to drive home. You will need a responsible individual to drive you home and stay with you for 24 hours after surgery.   If you are taking public transportation, you will need to have a responsible individual with you.  Please call the Pre-admissions Testing Dept. at [phone] if you have any questions about these instructions.  Surgery Visitation Policy:  Patients having surgery or a procedure may have two visitors.  Children under the age of 42 must have an adult with them who is not the patient.  Inpatient Visitation:    Visiting hours are 7 a.m. to 8 p.m. Up to four visitors are allowed at one time in a patient room. The visitors may rotate out with other people during the day.  One visitor age 51 or older may stay with the patient overnight and must be in the room by 8 p.m.   Merchandiser, Retail to address health-related social needs:  [email or web address]     Pre-operative 4 CHG Bath Instructions   You can play a key role in reducing the risk of infection after surgery. Your skin needs to be as free of germs as possible. You can reduce the number of germs on your skin by washing with CHG (chlorhexidine  gluconate) soap  before surgery. CHG is an antiseptic soap that kills germs and continues to kill germs even after washing.   DO NOT use if you have an allergy to chlorhexidine /CHG or antibacterial soaps. If your skin becomes reddened or irritated, stop using the CHG and notify one of our RNs at [phone].   Please shower with the CHG soap starting 4 days before surgery using the following schedule:     Please keep in mind the following:  DO NOT shave, including legs and underarms, starting the day of your first shower.   You may shave your face at any point before/day of surgery.  Place clean sheets on your bed the day you start using CHG soap. Use a clean washcloth (not used since being washed) for each shower. DO NOT sleep with pets once you start using the CHG.   CHG Shower Instructions:  If you choose to wash your hair and private area, wash first with your normal shampoo/soap.  After you use shampoo/soap, rinse your hair and body thoroughly to remove  shampoo/soap residue.  Turn the water OFF and apply about 3 tablespoons (45 ml) of CHG soap to a CLEAN washcloth.  Apply CHG soap ONLY FROM YOUR NECK DOWN TO YOUR TOES (washing for 3-5 minutes)  DO NOT use CHG soap on face, private areas, open wounds, or sores.  Pay special attention to the area where your surgery is being performed.  If you are having back surgery, having someone wash your back for you may be helpful. Wait 2 minutes after CHG soap is applied, then you may rinse off the CHG soap.  Pat dry with a clean towel  Put on clean clothes/pajamas   If you choose to wear lotion, please use ONLY the CHG-compatible lotions on the back of this paper.     Additional instructions for the day of surgery: DO NOT APPLY any lotions, deodorants, cologne, or perfumes.   Put on clean/comfortable clothes.  Brush your teeth.  Ask your nurse before applying any prescription medications to the skin.      CHG Compatible Lotions   Aveeno Moisturizing  lotion  Cetaphil Moisturizing Cream  Cetaphil Moisturizing Lotion  Clairol Herbal Essence Moisturizing Lotion, Dry Skin  Clairol Herbal Essence Moisturizing Lotion, Extra Dry Skin  Clairol Herbal Essence Moisturizing Lotion, Normal Skin  Curel Age Defying Therapeutic Moisturizing Lotion with Alpha Hydroxy  Curel Extreme Care Body Lotion  Curel Soothing Hands Moisturizing Hand Lotion  Curel Therapeutic Moisturizing Cream, Fragrance-Free  Curel Therapeutic Moisturizing Lotion, Fragrance-Free  Curel Therapeutic Moisturizing Lotion, Original Formula  Eucerin Daily Replenishing Lotion  Eucerin Dry Skin Therapy Plus Alpha Hydroxy Crme  Eucerin Dry Skin Therapy Plus Alpha Hydroxy Lotion  Eucerin Original Crme  Eucerin Original Lotion  Eucerin Plus Crme Eucerin Plus Lotion  Eucerin TriLipid Replenishing Lotion  Keri Anti-Bacterial Hand Lotion  Keri Deep Conditioning Original Lotion Dry Skin Formula Softly Scented  Keri Deep Conditioning Original Lotion, Fragrance Free Sensitive Skin Formula  Keri Lotion Fast Absorbing Fragrance Free Sensitive Skin Formula  Keri Lotion Fast Absorbing Softly Scented Dry Skin Formula  Keri Original Lotion  Keri Skin Renewal Lotion Keri Silky Smooth Lotion  Keri Silky Smooth Sensitive Skin Lotion  Nivea Body Creamy Conditioning Oil  Nivea Body Extra Enriched Teacher, Adult Education Moisturizing Lotion Nivea Crme  Nivea Skin Firming Lotion  NutraDerm 30 Skin Lotion  NutraDerm Skin Lotion  NutraDerm Therapeutic Skin Cream  NutraDerm Therapeutic Skin Lotion  ProShield Protective Hand Cream  Provon moisturizing lotion

## 2024-08-30 ENCOUNTER — Encounter: Payer: Self-pay | Admitting: Neurosurgery

## 2024-08-30 ENCOUNTER — Telehealth: Payer: Self-pay

## 2024-08-30 NOTE — Progress Notes (Signed)
 " Perioperative / Anesthesia Services  Pre-Admission Testing Clinical Review / Pre-Operative Anesthesia Consult  Date: 08/30/24  PATIENT DEMOGRAPHICS: Name: Brian Payne DOB: [birth year] MRN:   [id number]  Note: Available PAT nursing documentation and vital signs have been reviewed. Clinical nursing staff has updated patient's PMH/PSHx, current medication list, and drug allergies/intolerances to ensure complete and comprehensive history available to assist care teams in MDM as it pertains to the aforementioned surgical procedure and anticipated anesthetic course. Extensive review of available clinical information personally performed. Nursing documentation reviewed. [place] [facility] and PSHx updated with any diagnoses and/or procedures that I have knowledge of that may have been inadvertently omitted during his intake with the pre-admission testing department's nursing staff.  PLANNED SURGICAL PROCEDURE(S):   Case: [id number] Date/Time: 08/31/24 1307   Procedures:      POSTERIOR CERVICAL FUSION/FORAMINOTOMY LEVEL 2 - C7-T2 POSTERIOR SPINAL FUSION, T1-2 POSTERIOR SPINAL DECOMPRESSION     APPLICATION OF INTRAOPERATIVE CT SCAN   Anesthesia type: General   Diagnosis:      Thoracic myelopathy [M47.14]     Spinal stenosis, thoracic [M48.04]   Pre-op diagnosis:      M47.14 Thoracic myelopathy     M48.04 Spinal stenosis, thoracic   Location: [facility] OR ROOM 03 / [facility]   Surgeons: Clois Fret, MD        CLINICAL DISCUSSION: Brian Payne is a 69 y.o. male who is submitted for pre-surgical anesthesia review and clearance prior to him undergoing the above procedure. Patient has never been a smoker in the past. Pertinent PMH includes: CAD, diastolic dysfunction, HTN, HLD, CKD-III, OSAH (on nocturnal PAP therapy), hiatal hernia, anemia, OA, cervical DDD (s/p ACDF C3-C7), thoracic spinal stenosis with myelopathy, LEFT convex scoliosis, idiopathic peripheral  neuropathy.  Patient is followed by cardiology (Applefeld, MD). He was last seen in the cardiology clinic on 06/10/2024; notes reviewed. At the time of his clinic visit, patient doing well overall from a cardiovascular perspective. Patient denied any chest pain, shortness of breath, PND, orthopnea, palpitations, significant peripheral edema, weakness, fatigue, vertiginous symptoms, or presyncope/syncope. Patient with a past medical history significant for cardiovascular diagnoses. Documented physical exam was grossly benign, providing no evidence of acute exacerbation and/or decompensation of the patient's known cardiovascular conditions.  Patient underwent diagnostic LEFT heart catheterization on 09/27/2010.  Study revealed a complete total occlusion of the mid LAD and 70% stenosis of the RCA.  PCI was subsequently performed placing a 2.75 x 30 mm Resolute DES x 1 to the mid LAD followed by a 3.0 x 26 mm Resolute DES x 1 more proximally.  Procedure yielded excellent angiographic result and TIMI-3 flow.  Patient underwent repeat diagnostic LEFT heart catheterization on 06/05/2017 revealing a 99% stenosis of the mid LCx and a 60% stenosis of D1.  PCI was subsequently performed placing a 4.0 x 15 mm DES x 1 to the mid LCx.  Procedure yielded excellent angiographic result and TIMI-3 flow.  Most recent TTE performed on 12/25/2023 revealed a normal left ventricular systolic function with an EF of >55%. There was moderate LVH.  There were no regional wall motion abnormalities. Left ventricular diastolic Doppler parameters consistent with abnormal relaxation (G1DD). GLS -18.8% (normal range <-18%). Right ventricular size and function normal with a TAPSE measuring 2.5 cm  (normal range >/= 1.6 cm). There was mild aortic and mitral valve regurgitation.  RVSP = 13 mmHg.  All transvalvular gradients were noted to be normal providing no evidence of hemodynamically significant  valvular stenosis. Aorta normal in size with  no evidence of ectasia or aneurysmal dilatation.  Blood pressure reasonably controlled at 132/70 mmHg on currently prescribed CCB (amlodipine), ARB (losartan ), and a beta-blocker (metoprolol  succinate) therapies.  Patient is on pravastatin  + ezetimibe for his HLD diagnosis and ASCVD prevention.  Patient has a supply of short acting nitrates (NTG) to use on an as needed basis for recurrent angina/anginal equivalent symptoms; denied recent use.  Patient is not diabetic.  Patient does have an OSAH diagnosis and is reported be compliant with prescribed nocturnal PAP therapy. Patient is able to complete all of his  ADL/IADLs without cardiovascular limitation.  Per the DASI, patient is able to achieve at least 4 METS of physical activity without experiencing any significant degree of angina/anginal equivalent symptoms. No changes were made to his medication regimen during his visit with cardiology.  Patient scheduled to follow-up with outpatient cardiology in 6 months or sooner if needed.  Brian Payne is scheduled for an elective POSTERIOR CERVICAL FUSION/FORAMINOTOMY LEVEL 2; APPLICATION OF INTRAOPERATIVE CT SCAN on 08/31/2024 with Dr. Reeves Daisy, *. Given patient's past medical history significant for cardiovascular diagnoses, presurgical cardiac clearance was sought by the PAT team. Per cardiology, this patient is optimized for surgery and may proceed with the planned procedural course with a MODERATE risk of significant perioperative cardiovascular complications.  In review of the patient's medication reconciliation, it is noted that he is on daily oral antithrombotic therapy. Given that patient's past medical history is significant for cardiovascular diagnoses, including but not limited to CAD, neurosurgery has cleared patient to continue his daily low dose ASA throughout his perioperative course.  Patient has been updated on these directives from his specialty care providers by the PAT  team.  Patient reports previous perioperative complications with anesthesia in the past.  Patient with a history of a (+) difficult airway in the past secondary to reduced cervical ROM and anterior laryngeal anatomy.  In review his EMR, it is noted that patient underwent a general anesthetic course here at [facility] ([facility]) in 03/2017 without documented complications.   MOST RECENT VITAL SIGNS:    08/25/2024   10:19 AM 08/11/2024   10:37 AM 06/21/2024    3:50 PM  Vitals with BMI  Height 6' 0 6' 0 6' 0  Weight 241 lbs 14 oz 241 lbs 241 lbs 2 oz  BMI 32.79 32.68 32.7  Systolic 129 136 853  Diastolic 73 74 72  Pulse 70     PROVIDERS/SPECIALISTS: NOTE: Primary physician provider listed below. Patient may have been seen by APP or partner within same practice.   PROVIDER ROLE / SPECIALTY [facility]ST [facility] Daisy Reeves, MD Neurosurgery (Surgeon) 08/11/2024  Auston Reyes BIRCH, MD Primary Care Provider 04/06/2024  Leonore George, MD Cardiology 06/10/2024  Maree Hila, MD Neurology 07/12/2024  Dominica Brandy, MD Nephrology 05/24/2024   ALLERGIES: Crestor [rosuvastatin], Levofloxacin , Codeine , and Nortriptyline  CURRENT HOME MEDICATIONS:  allopurinol  (ZYLOPRIM ) 300 MG tablet   amLODipine (NORVASC) 5 MG tablet   aspirin EC 81 MG tablet   b complex vitamins tablet   Cyanocobalamin (VITAMIN B12 PO)   ezetimibe (ZETIA) 10 MG tablet   gabapentin  (NEURONTIN ) 300 MG capsule   GLUCOSAMINE HCL PO   HYDROcodone -acetaminophen  (NORCO) 10-325 MG tablet   losartan  (COZAAR ) 100 MG tablet   metoprolol  succinate (TOPROL -XL) 25 MG 24 hr tablet   Omega-3 Fatty Acids (FISH OIL) 1200 MG CAPS   Oxymetazoline HCl-Menthol (AFRIN MENTHOL  SPRAY NA)   pravastatin  (PRAVACHOL ) 20 MG tablet   nitroGLYCERIN  (NITROSTAT ) 0.4 MG SL tablet   NON FORMULARY   predniSONE (STERAPRED UNI-PAK 21 TAB) 10 MG (21) TBPK tablet   HISTORY: Past Medical History:  Diagnosis Date    Anemia    Arthritis    Chronic neck pain    CKD (chronic kidney disease), stage III (HCC)    Claustrophobia    Coronary artery disease    a.) PCI 09/27/2010: CTO mLAD (3.0 x 26 mm Resolute DES p-mLAD and 2.75 x 30 mm Resolute DES); b.) LHC 06/05/2017: 99% mLCx (4.0 x 15 mm DES)   DDD (degenerative disc disease), cervical    a.) s/p ACDF C3-C7   Diastolic dysfunction    Difficult intubation    a.) reduced cervical ROM; b.) anterior laryngeal anatomy   Gout    H/O heart artery stent    a.) TOTAL # stents as of 08/30/2024: 3   History of hiatal hernia    HLD (hyperlipidemia)    Hypertension    Idiopathic peripheral neuropathy    Long-term use of aspirin therapy    Scoliosis (LEFT convex)    Sleep apnea with use of continuous positive airway pressure (CPAP)    Spinal stenosis, thoracic    Past Surgical History:  Procedure Laterality Date   ANTERIOR FUSION CERVICAL SPINE  01/2016   BILATERAL CARPAL TUNNEL RELEASE  09/2013   COLONOSCOPY  08/14/2010   TA - rec 3 yr follow up ([facility])   CORONARY ANGIOPLASTY WITH STENT PLACEMENT Left 09/27/2010   Procedure: CORONARY ANGIOPLASTY WITH STENT PLACEMENT; Location: [facility]; Surgeon: Dominique Estelle, MD   CORONARY ANGIOPLASTY WITH STENT PLACEMENT  06/05/2017   Procedure: CORONARY ANGIOPLASTY WITH STENT PLACEMENT; Location: [facility]; Surgeon: Dominique Estelle, MD   DISTAL BICEPS TENDON REPAIR Right 03/30/2017   Procedure: DISTAL BICEPS TENDON REPAIR;  Surgeon: Cleotilde Barrio, MD;  Location: [facility] ORS;  Service: Orthopedics;  Laterality: Right;   HYDROCELE EXCISION     VASECTOMY     Family History  Problem Relation Age of Onset   Rheum arthritis Mother    Rheumatic fever Father    Social History   Tobacco Use   Smoking status: Never   Smokeless tobacco: Never  Substance Use Topics   Alcohol use: No    Alcohol/week: 0.0 standard drinks of alcohol   LABS:  Hospital Outpatient Visit on 08/25/2024  Component Date Value Ref Range Status   MRSA,  PCR 08/25/2024 NEGATIVE  NEGATIVE Final   Staphylococcus aureus 08/25/2024 NEGATIVE  NEGATIVE Final   Comment: (NOTE) The Xpert SA Assay (FDA approved for NASAL specimens in patients 57 years of age and older), is one component of a comprehensive surveillance program. It is not intended to diagnose infection nor to guide or monitor treatment. Performed at [facility], [address] [place], [place] [address]    WBC 08/25/2024 5.3  4.0 - 10.5 K/uL Final   RBC 08/25/2024 3.61 (L)  4.22 - 5.81 MIL/uL Final   Hemoglobin 08/25/2024 11.5 (L)  13.0 - 17.0 g/dL Final   HCT 98/77/7973 34.6 (L)  39.0 - 52.0 % Final   MCV 08/25/2024 95.8  80.0 - 100.0 fL Final   MCH 08/25/2024 31.9  26.0 - 34.0 pg Final   MCHC 08/25/2024 33.2  30.0 - 36.0 g/dL Final   RDW 98/77/7973 12.9  11.5 - 15.5 % Final   Platelets 08/25/2024 155  150 - 400 K/uL Final   nRBC 08/25/2024 0.0  0.0 - 0.2 % Final   Performed at [facility], [address] [place], [place] [address]   Sodium 08/25/2024 143  135 - 145 mmol/L Final   Potassium 08/25/2024 4.6  3.5 - 5.1 mmol/L Final   Chloride 08/25/2024 109  98 - 111 mmol/L Final   CO2 08/25/2024 22  22 - 32 mmol/L Final   Glucose, Bld 08/25/2024 68 (L)  70 - 99 mg/dL Final   Glucose reference range applies only to samples taken after fasting for at least 8 hours.   BUN 08/25/2024 33 (H)  8 - 23 mg/dL Final   Creatinine, Ser 08/25/2024 1.69 (H)  0.61 - 1.24 mg/dL Final   Calcium 98/77/7973 9.7  8.9 - 10.3 mg/dL Final   GFR, Estimated 08/25/2024 44 (L)  >60 mL/min Final   Comment: (NOTE) Calculated using the CKD-EPI Creatinine Equation (2021)    Anion gap 08/25/2024 12  5 - 15 Final   Performed at [facility], [address] [place], [place] [address]   ABO/RH(D) 08/25/2024 A POS   Final   Antibody Screen 08/25/2024 NEG   Final   Sample Expiration 08/25/2024 09/08/2024,2359   Final   Extend sample reason 08/25/2024    Final                    Value:NO TRANSFUSIONS OR PREGNANCY IN THE PAST 3 MONTHS Performed at [facility], [address] [place], [place] [address]     ECG: Date: 08/25/2024  Time ECG obtained: 1147 AM Rate: 61 bpm Rhythm: normal sinus Axis (leads I and aVF): normal Intervals: PR 196 ms. QRS 80 ms. QTc 376 ms. ST segment and T wave changes: No evidence of acute T wave abnormalities or significant ST segment elevation or depression.  Evidence of a possible, age undetermined, prior infarct:  No Comparison: Similar to previous tracing obtained on 07/15/2023  IMAGING / PROCEDURES: MRI SPINE THORACIC WO CONTRAST performed on 08/02/2024 Thoracic spondylosis consistent with DISH. Severe central canal and bilateral foraminal stenosis at T1-T2 with discogenic marrow edema. No cord signal abnormality. Right lateral mixed spondylotic changes in the mid thoracic spine with potential exiting nerve root compression.  Complex right paratracheal/superior mediastinal mass of indeterminate etiology but possibly related to mediastinal goiter. Suggest CT of the chest with contrast for further evaluation.   MRI SPINE CERVICAL WO CONTRAST performed on 08/02/2024 Satisfactory C3-C7 ACDF. Moderate left proximal foraminal disc osteophyte complex at C3-4 compresses the exiting left C4 nerve root. Small right paracentral disc osteophyte complex at C5-6 abuts the ventral spinal cord and exiting C6 nerve root. Small right foraminal osteophyte at C6-7 compresses the right C7 nerve root.  Severe multifactorial spinal and bilateral foraminal stenosis at T1-T2 with compression of the spinal cord and exiting T1 nerve roots. No definite myelopathy.  Ankylosis of the right C2-3 facet joint.   DG LUMBAR SPINE 2-3 VIEWS performed on 06/21/2024 No acute fracture. Left convex scoliosis. Multilevel spondylosis and facet hypertrophy most pronounced at L3-4 and L4-5.  TRANSTHORACIC ECHOCARDIOGRAM performed on 12/25/2023 Normal left  ventricular systolic function with an EF of >55% Moderate asymmetrical septal hypertrophy at the base/sigmoid septum No regional wall motion abnormalities Left ventricular diastolic Doppler parameters consistent with abnormal relaxation (G1DD). Normal right ventricular size and function Moderate AR and MR RVSP = 13 mmHg Normal gradients; no valvular stenosis  IMPRESSION AND PLAN: Brian Payne has been referred for pre-anesthesia review and clearance prior to him undergoing the  planned anesthetic and procedural courses. Available labs, pertinent testing, and imaging results were personally reviewed by me in preparation for upcoming operative/procedural course. [facility] medical record has been updated following extensive record review and patient interview with PAT staff.   This patient has been appropriately cleared by cardiology with an overall MODERATE risk of patient experiencing significant perioperative cardiovascular complications. here at [facility]. Based on clinical review performed today (08/30/24), barring any significant acute changes in the patient's overall condition, it is anticipated that he will be able to proceed with the planned surgical intervention. Any acute changes in clinical condition may necessitate his procedure being postponed and/or cancelled. Patient will meet with anesthesia team (MD and/or CRNA) on the day of his procedure for preoperative evaluation/assessment. Questions regarding anesthetic course will be fielded at that time.   Pre-surgical instructions were reviewed with the patient during his PAT appointment, and questions were fielded to satisfaction by PAT clinical staff. He has been instructed on which medications that he will need to hold prior to surgery, as well as the ones that have been deemed safe/appropriate to take on the day of his procedure. As part of the general education provided by PAT, patient made aware both  verbally and in writing, that he would need to abstain from the use of any illegal substances during his perioperative course. He was advised that failure to follow the provided instructions could necessitate case cancellation or result in serious perioperative complications up to and including death. Patient encouraged to contact PAT and/or his surgeon's office to discuss any questions or concerns that may arise prior to surgery; verbalized understanding.   Dorise Pereyra, MSN, APRN, FNP-C, CEN [facility]  Perioperative Services Nurse Practitioner Phone: [phone] Fax: [phone] 08/30/24 9:33 AM  NOTE: This note has been prepared using Dragon dictation software. Despite my best ability to proofread, there is always the potential that unintentional transcriptional errors may still occur from this process. "

## 2024-08-30 NOTE — Telephone Encounter (Signed)
 Mr Pokorny called in to request to postpone his surgery due to concerns regarding ice and that he might slip on the ice from the recent inclement weather while recovering from surgery if he proceeds tomorrow. He requested to postpone his surgery to 10/05/24.  I have notified the OR, reps, monitoring, pre-admit testing, and insurance. His post-op appointments were moved accordingly.

## 2024-08-31 HISTORY — DX: Spinal stenosis, thoracic region: M48.04

## 2024-08-31 HISTORY — DX: Hereditary and idiopathic neuropathy, unspecified: G60.9

## 2024-09-15 ENCOUNTER — Encounter: Admitting: Orthopedic Surgery

## 2024-10-05 ENCOUNTER — Ambulatory Visit: Admission: RE | Admit: 2024-10-05 | Source: Home / Self Care | Admitting: Neurosurgery

## 2024-10-05 ENCOUNTER — Encounter: Admission: RE | Payer: Self-pay | Source: Home / Self Care

## 2024-10-05 ENCOUNTER — Encounter: Payer: Self-pay | Admitting: Urgent Care

## 2024-10-05 HISTORY — DX: Other cervical disc degeneration, unspecified cervical region: M50.30

## 2024-10-05 HISTORY — DX: Hyperlipidemia, unspecified: E78.5

## 2024-10-05 HISTORY — DX: Chronic kidney disease, stage 3 unspecified: N18.30

## 2024-10-05 HISTORY — DX: Other ill-defined heart diseases: I51.89

## 2024-10-05 HISTORY — DX: Long term (current) use of aspirin: Z79.82

## 2024-10-05 HISTORY — DX: Scoliosis, unspecified: M41.9

## 2024-10-11 ENCOUNTER — Other Ambulatory Visit

## 2024-10-11 ENCOUNTER — Encounter: Admitting: Neurosurgery

## 2024-10-18 ENCOUNTER — Encounter: Admitting: Orthopedic Surgery

## 2024-11-15 ENCOUNTER — Encounter: Admitting: Neurosurgery

## 2024-11-15 ENCOUNTER — Other Ambulatory Visit

## 2024-11-22 ENCOUNTER — Other Ambulatory Visit

## 2024-11-22 ENCOUNTER — Encounter: Admitting: Orthopedic Surgery

## 2024-12-27 ENCOUNTER — Other Ambulatory Visit

## 2024-12-27 ENCOUNTER — Encounter: Admitting: Orthopedic Surgery
